# Patient Record
Sex: Female | Born: 1955
Health system: Southern US, Community
[De-identification: ages and names within clinical notes are randomized; demographics above are authoritative.]

## PROBLEM LIST (undated history)

## (undated) DIAGNOSIS — I10 Essential (primary) hypertension: Secondary | ICD-10-CM

## (undated) DIAGNOSIS — J45909 Unspecified asthma, uncomplicated: Secondary | ICD-10-CM

## (undated) DIAGNOSIS — F32A Depression, unspecified: Secondary | ICD-10-CM

## (undated) DIAGNOSIS — E785 Hyperlipidemia, unspecified: Secondary | ICD-10-CM

## (undated) DIAGNOSIS — R7303 Prediabetes: Secondary | ICD-10-CM

## (undated) DIAGNOSIS — K5909 Other constipation: Secondary | ICD-10-CM

## (undated) DIAGNOSIS — Z9289 Personal history of other medical treatment: Secondary | ICD-10-CM

## (undated) DIAGNOSIS — F329 Major depressive disorder, single episode, unspecified: Secondary | ICD-10-CM

## (undated) HISTORY — DX: Other constipation: K59.09

## (undated) HISTORY — DX: Hyperlipidemia, unspecified: E78.5

## (undated) HISTORY — PX: TONSILLECTOMY: SUR1361

## (undated) HISTORY — DX: Essential (primary) hypertension: I10

## (undated) HISTORY — PX: SHOULDER SURGERY: SHX246

## (undated) HISTORY — DX: Unspecified asthma, uncomplicated: J45.909

## (undated) HISTORY — DX: Personal history of other medical treatment: Z92.89

## (undated) HISTORY — PX: ABDOMINAL HYSTERECTOMY: SHX81

---

## 1999-12-13 ENCOUNTER — Other Ambulatory Visit: Admission: RE | Admit: 1999-12-13 | Discharge: 1999-12-13 | Payer: Self-pay | Admitting: Obstetrics and Gynecology

## 2001-03-29 ENCOUNTER — Other Ambulatory Visit: Admission: RE | Admit: 2001-03-29 | Discharge: 2001-03-29 | Payer: Self-pay | Admitting: Obstetrics and Gynecology

## 2002-05-06 ENCOUNTER — Other Ambulatory Visit: Admission: RE | Admit: 2002-05-06 | Discharge: 2002-05-06 | Payer: Self-pay | Admitting: Obstetrics and Gynecology

## 2003-05-12 ENCOUNTER — Other Ambulatory Visit: Admission: RE | Admit: 2003-05-12 | Discharge: 2003-05-12 | Payer: Self-pay | Admitting: Obstetrics and Gynecology

## 2003-09-08 ENCOUNTER — Inpatient Hospital Stay (HOSPITAL_COMMUNITY): Admission: RE | Admit: 2003-09-08 | Discharge: 2003-09-10 | Payer: Self-pay | Admitting: Obstetrics and Gynecology

## 2003-09-08 ENCOUNTER — Encounter (INDEPENDENT_AMBULATORY_CARE_PROVIDER_SITE_OTHER): Payer: Self-pay | Admitting: Specialist

## 2004-09-09 ENCOUNTER — Other Ambulatory Visit: Admission: RE | Admit: 2004-09-09 | Discharge: 2004-09-09 | Payer: Self-pay | Admitting: Obstetrics and Gynecology

## 2004-09-23 ENCOUNTER — Ambulatory Visit: Payer: Self-pay | Admitting: Internal Medicine

## 2004-10-03 ENCOUNTER — Ambulatory Visit: Payer: Self-pay | Admitting: Internal Medicine

## 2004-10-03 ENCOUNTER — Ambulatory Visit: Payer: Self-pay | Admitting: Pulmonary Disease

## 2005-06-01 ENCOUNTER — Ambulatory Visit: Payer: Self-pay | Admitting: Internal Medicine

## 2005-09-11 ENCOUNTER — Other Ambulatory Visit: Admission: RE | Admit: 2005-09-11 | Discharge: 2005-09-11 | Payer: Self-pay | Admitting: Obstetrics and Gynecology

## 2006-12-12 ENCOUNTER — Ambulatory Visit: Payer: Self-pay | Admitting: Cardiology

## 2006-12-12 LAB — CONVERTED CEMR LAB
ALT: 39 units/L (ref 0–40)
Bilirubin, Direct: 0.1 mg/dL (ref 0.0–0.3)
Cholesterol: 246 mg/dL (ref 0–200)
Total CHOL/HDL Ratio: 3.1
Total Protein: 7.3 g/dL (ref 6.0–8.3)
Triglycerides: 150 mg/dL — ABNORMAL HIGH (ref 0–149)

## 2007-03-21 ENCOUNTER — Ambulatory Visit: Payer: Self-pay | Admitting: Internal Medicine

## 2008-02-20 ENCOUNTER — Ambulatory Visit: Payer: Self-pay | Admitting: Internal Medicine

## 2008-03-04 ENCOUNTER — Ambulatory Visit: Payer: Self-pay | Admitting: Internal Medicine

## 2008-03-04 ENCOUNTER — Encounter: Payer: Self-pay | Admitting: Pulmonary Disease

## 2008-03-04 LAB — CONVERTED CEMR LAB
AST: 26 units/L (ref 0–37)
Vit D, 1,25-Dihydroxy: 52 (ref 30–89)

## 2008-03-09 ENCOUNTER — Ambulatory Visit: Payer: Self-pay | Admitting: Internal Medicine

## 2008-10-21 DIAGNOSIS — Z9089 Acquired absence of other organs: Secondary | ICD-10-CM | POA: Insufficient documentation

## 2008-10-21 DIAGNOSIS — F3289 Other specified depressive episodes: Secondary | ICD-10-CM | POA: Insufficient documentation

## 2008-10-21 DIAGNOSIS — F329 Major depressive disorder, single episode, unspecified: Secondary | ICD-10-CM

## 2008-10-21 DIAGNOSIS — E785 Hyperlipidemia, unspecified: Secondary | ICD-10-CM | POA: Insufficient documentation

## 2008-10-21 DIAGNOSIS — Z87898 Personal history of other specified conditions: Secondary | ICD-10-CM | POA: Insufficient documentation

## 2009-03-24 ENCOUNTER — Encounter: Admission: RE | Admit: 2009-03-24 | Discharge: 2009-03-24 | Payer: Self-pay | Admitting: Obstetrics and Gynecology

## 2009-03-29 ENCOUNTER — Ambulatory Visit: Payer: Self-pay | Admitting: Internal Medicine

## 2009-04-13 ENCOUNTER — Telehealth: Payer: Self-pay | Admitting: Internal Medicine

## 2009-04-29 ENCOUNTER — Ambulatory Visit: Payer: Self-pay | Admitting: Internal Medicine

## 2009-05-10 ENCOUNTER — Telehealth: Payer: Self-pay | Admitting: Internal Medicine

## 2009-07-14 ENCOUNTER — Telehealth: Payer: Self-pay | Admitting: Internal Medicine

## 2009-07-16 ENCOUNTER — Encounter: Payer: Self-pay | Admitting: Internal Medicine

## 2010-01-21 ENCOUNTER — Encounter (INDEPENDENT_AMBULATORY_CARE_PROVIDER_SITE_OTHER): Payer: Self-pay | Admitting: *Deleted

## 2010-03-15 ENCOUNTER — Ambulatory Visit: Payer: Self-pay | Admitting: Internal Medicine

## 2010-04-18 ENCOUNTER — Ambulatory Visit: Payer: Self-pay | Admitting: Internal Medicine

## 2010-04-18 DIAGNOSIS — G4731 Primary central sleep apnea: Secondary | ICD-10-CM | POA: Insufficient documentation

## 2010-05-04 ENCOUNTER — Encounter: Payer: Self-pay | Admitting: Internal Medicine

## 2010-05-04 ENCOUNTER — Telehealth (INDEPENDENT_AMBULATORY_CARE_PROVIDER_SITE_OTHER): Payer: Self-pay | Admitting: *Deleted

## 2010-05-16 ENCOUNTER — Telehealth: Payer: Self-pay | Admitting: Internal Medicine

## 2010-06-27 ENCOUNTER — Ambulatory Visit: Payer: Self-pay | Admitting: Internal Medicine

## 2010-07-13 ENCOUNTER — Encounter: Payer: Self-pay | Admitting: Internal Medicine

## 2010-07-13 LAB — CONVERTED CEMR LAB
Direct LDL: 113.9 mg/dL
HDL: 93.3 mg/dL (ref >39.00)
VLDL: 21.2 mg/dL (ref 0.0–40.0)

## 2010-08-29 ENCOUNTER — Encounter: Payer: Self-pay | Admitting: Obstetrics and Gynecology

## 2010-09-04 LAB — CONVERTED CEMR LAB: AST: 36 units/L (ref 0–37)

## 2010-09-06 NOTE — Progress Notes (Signed)
   Walk in Patient Form Recieved " Pt dropped off papers from [employer]" gave to Message Nurse [facility]  May 04, 2010 1:06 PM

## 2010-09-06 NOTE — Progress Notes (Signed)
Summary: calling for test results   Phone Note Call from Patient   Caller: Patient [phone] Reason for Call: Talk to Nurse, Lab or Test Results Summary of Call: pt calling for test results Initial call taken by: Glynda Jaeger,  May 16, 2010 11:47 AM  Follow-up for Phone Call        Called patient and [facility] that I have not received results of o2 overnight sats from Dr.Ross but I will follow up on this and call her back.  Layne Benton, RN, BSN  May 16, 2010 5:27 PM   Additional Follow-up for Phone Call Additional follow up Details #1::        Patient oxygen does go down below 90% about 2.5% of time sleeping.  Probably not significant sleep apnea given so little desat  Pateint did not want CPAP anyway. Additional Follow-up by: Sherrill Raring, MD, [facility],  May 18, 2010 11:23 AM     Appended Document: calling for test results Patient aware of above.

## 2010-09-06 NOTE — Assessment & Plan Note (Signed)
Summary: yearly/sl/appt time 4:15/lg  Medications Added FISH OIL 1000 MG CAPS (OMEGA-3 FATTY ACIDS) take one daily CRESTOR 20 MG TABS (ROSUVASTATIN CALCIUM) 1 tab every day        Visit Type:  Follow-up Primary Provider:  [facility]  CC:  no complaints.  History of Present Illness: patient is a 55 year old with a history of dyslipidemia.  I last saw her earlier this year.  she has been switched to crestor 5.  She is doing OK on this.  ON simvistatin she had some numbness in hands when using them.  This has improved.  No achieness elsewhere. Breathing is OK.  She is fatigued alot, falls asleep if not doing anything.   Husband says she snores but does not have sleep apnea. No chest pains.  Water aerobics 3x per wk.  Current Medications (verified): 1)  Advair Diskus 250-50 Mcg/dose Aepb (Fluticasone-Salmeterol) .... As Needed 2)  Ventolin Hfa 108 (90 Base) Mcg/act Aers (Albuterol Sulfate) .... As Needed 3)  Cymbalta 30 Mg Cpep (Duloxetine Hcl) .Marland Kitchen.. 1 Cap Once Daily .Huel Cote Unsure of Dose 4)  Multivitamins   Tabs (Multiple Vitamin) .Marland Kitchen.. 1 Tab Once Daily 5)  Crestor 5 Mg Tabs (Rosuvastatin Calcium) .Marland Kitchen.. 1 Tablet Every Day At Bedtime- As Directed 6)  Fish Oil 1000 Mg Caps (Omega-3 Fatty Acids) .... Take One Daily  Allergies: 1)  ! Tetracycline  Past History:  Past medical, surgical, family and social histories (including risk factors) reviewed, and no changes noted (except as noted below).  Past Medical History: Reviewed history from 10/21/2008 and no changes required. DEPRESSION (ICD-311) RHINITIS, HX OF (ICD-V13.8) ASTHMA, HX OF (ICD-493.90) DYSLIPIDEMIA (ICD-272.4)    Past Surgical History: Reviewed history from 10/21/2008 and no changes required. TONSILLECTOMY, HX OF (ICD-V45.79) HYSTERECTOMY (ICD-V88.01)  Family History: Reviewed history and no changes required.  Social History: Reviewed history from 10/21/2008 and no changes required. Full  Time Married  No tobacco  Vital Signs:  Patient profile:   55 year old female Height:      61.5 inches Weight:      160 pounds BMI:     29.85 Pulse rate:   104 / minute Pulse rhythm:   regular BP sitting:   118 / 86  (right arm)  Vitals Entered By: Jacquelin Hawking, CMA (April 18, 2010 4:14 PM)  Physical Exam  Additional Exam:  Patient is in NAD HEENT:  Normocephalic, atraumatic. EOMI, PERRLA.  Neck: JVP is normal. No thyromegaly. No bruits.  Lungs: clear to auscultation. No rales no wheezes.  Heart: Regular rate and rhythm. Normal S1, S2. No S3.   No significant murmurs. PMI not displaced.  Abdomen:  Supple, nontender. Normal bowel sounds. No masses. No hepatomegaly.  Extremities:   Good distal pulses throughout. No lower extremity edema.  Musculoskeletal :moving all extremities.  Neuro:   alert and oriented x3.    EKG  Procedure date:  04/18/2010  Findings:      NSR>  93 bpm.  Impression & Recommendations:  Problem # 1:  DYSLIPIDEMIA (ICD-272.4) LDL and LDL particle number are not optimal.  I would recommend increasing Crestor to 10  (cut 20 in 1/2). F/U lipid and AST in 8 to 10 wks.  I am not convinced hand symptoms are due to statin.  FOllow.  Work on diet, wt loss.  Problem # 2:  CENTRAL SLEEP APNEA CONDS CLASSIFIED ELSEWHERE (ICD-327.27) I am not sure f the patient has apnea.  She says she would  not be able to toleated CPAP if she did because she sleeps on her stomach and side.  Will set up for O2 sat at night.  If desats, sleep study.  Other Orders: EKG w/ Interpretation (93000) Misc. Referral (Misc. Ref)  Patient Instructions: 1)  Your physician recommends that you return for a FASTING lipid profile: LIPOMED and AST in 10 weeks 272.0 2)  Your physician wants you to follow-up in: 12 months with Dr.Alonso Gapinski  You will receive a reminder letter in the mail two months in advance. If you don't receive a letter, please call our office to schedule the follow-up  appointment. Prescriptions: CRESTOR 20 MG TABS (ROSUVASTATIN CALCIUM) 1 tab every day  #30 x 6   Entered by:   Layne Benton, RN, BSN   Authorized by:   Sherrill Raring, MD, [facility]   Signed by:   Layne Benton, RN, BSN on 04/18/2010   Method used:   Electronically to        [facility] (retail)       [address]       [place], [place]  [address]       Ph: [phone] or [phone]       Fax: [phone]   RxID:   [id number]

## 2010-09-06 NOTE — Letter (Signed)
Summary: Appointment - Reminder 2  [facility], Main Office  [address]   [place], [place] [address]   Phone: [phone]  Fax: [phone]     January 21, 2010 MRN: [id number]   [name] [address] [place], [place]  [address]   Dear Ms. Abood,  Our records indicate that it is time to schedule a follow-up appointment with Dr. Tenny Craw in August. It is very important that we reach you to schedule this appointment. We look forward to participating in your health care needs. Please contact us at the number listed above at your earliest convenience to schedule your appointment.  If you are unable to make an appointment at this time, give [place] a call so we can update our records.     Sincerely,   [facility] Scheduling Team

## 2010-09-06 NOTE — Letter (Signed)
Summary: [place] - Walk-In Pt Form  [place] - Walk-In Pt Form   Imported By: Marylou Mccoy 07/05/2010 13:26:17  _____________________________________________________________________  External Attachment:    Type:   Image     Comment:   External Document

## 2010-09-06 NOTE — Letter (Signed)
Summary: [name]   Imported By: Marylou Mccoy 05/20/2010 11:35:16  _____________________________________________________________________  External Attachment:    Type:   Image     Comment:   External Document

## 2010-12-20 NOTE — Assessment & Plan Note (Signed)
[facility]                            CARDIOLOGY OFFICE NOTE   NAME:[name], [name]                      MRN:          [id number]  DATE:02/20/2008                            DOB:          06-17-1956    IDENTIFICATION:  [name] is a 55 year old woman.  I last saw her in  clinic back in May 2008.  She has a history of dyslipidemia, asthma.  Question sleep apnea.   Since seen, she has done okay.  She states she is sleeping better.  She  denies significant shortness of breath.  She is trying to exercise more  and trying to work on cutting her weight down.   CURRENT MEDICINES:  1. Advair 250/50.  2. Cymbalta.  3. Simvastatin 10.   PHYSICAL EXAMINATION:  GENERAL:  The patient is in no distress.  VITAL SIGNS:  Blood pressure is 123/86, pulse 91 and regular, and weight  is 162.  LUNGS:  Clear.  Moving air well.  CARDIAC:  Regular rate and rhythm.  S1 and S2.  No murmurs.  ABDOMEN:  Benign.  EXTREMITIES:  No edema.   A 12-lead EKG, normal sinus rhythm, 86 beats per minute.   IMPRESSION:  1. Dyslipidemia.  Need to get fasting lipids.  I will be in touch with      her when I have seen the test results.  2. History of asthma, doing well clinically.  3. History of question sleep apnea.  The patient declined sleep study      in the past.  4. Obesity.  Again, counseled on exercise, diet, and trying to get her      weight down, indeed her dyslipidemia may improve significantly.   Tentatively, I plan to be in touch with the patient regarding the test  results and otherwise set followup for 1 year.     Pricilla Riffle, MD, [facility]  Electronically Signed    PVR/MedQ  DD: 02/20/2008  DT: 02/21/2008  Job #: [id number]   cc:   [name], M.D.

## 2010-12-23 NOTE — Op Note (Signed)
NAME:  [name], [name]                         ACCOUNT NO.:  [id number]   MEDICAL RECORD NO.:  [id number]                   PATIENT TYPE:  INP   LOCATION:  9316                                 [facility]ILITY:  [facility]   PHYSICIAN:  Duke Salvia. Marcelle Overlie, M.D.            DATE OF BIRTH:  [birth year]   DATE OF PROCEDURE:  09/08/2003  DATE OF DISCHARGE:                                 OPERATIVE REPORT   PREOPERATIVE DIAGNOSIS:  Symptomatic leiomyoma.   POSTOPERATIVE DIAGNOSIS:  Symptomatic leiomyoma.   PROCEDURES:  Diagnostic laparoscopy followed by open supracervical  hysterectomy.   SURGEON:  Duke Salvia. Marcelle Overlie, M.D.   ASSISTANT:  Guy Sandifer. Henderson Cloud, M.D.   ANESTHESIA:  General endotracheal.   COMPLICATIONS:  None.   DRAINS:  Foley catheter.   ESTIMATED BLOOD LOSS:  100.   SPECIMENS REMOVED:  Fundus of the uterus.   PROCEDURE AND FINDINGS:  The patient went to the operating room.  After an  adequate level of general endotracheal anesthesia was obtained with the  patient's legs in stirrups, the abdomen, perineum, and vagina were prepped  and draped with Betadine and the bladder was drained with a Foley catheter,  EUA carried out.  The uterus was 12 weeks' size with a fibroid on the right  lateral wall as noted previously.  A sound was positioned in the uterus and  held in place with a single-tooth tenaculum taped to the wound.  Attention  directed to the abdomen. A 2 cm subumbilical incision was made after  infiltrating with 0.5% Marcaine plain.  The Veress needle was introduced  without difficulty.  Its intra-abdominal position was verified by pressure  and water testing.  After a 2.5 L pneumoperitoneum was then created,  laparoscopic trocar and sleeve were then introduced without difficulty.  There was no evidence of any bleeding or trauma.  The patient was then  placed in Trendelenburg.  Initial inspection revealed the cul-de-sac was  free and clear.  The uterus was enlarged with a 7-8  cm fibroid on the right  lateral wall out to the sidewall.  I could get good visualization of the  left side of the uterus but, unfortunately, I did not think laparoscopically  I could get good access to the uterine vasculature pedicle on that side.  A  decision made to proceed with an open procedure.  A Pfannenstiel incision  was made, carried down to the fascia, which was incised and extended  transversely.  The rectus muscle was divided in the midline, the peritoneum  __________ without incident, and extended in a vertical manner.  The  O'Connor-O'Sullivan retractor was positioned, and the bowel was packed  superiorly out of the field.  A double-toothed tenaculum was used to grab  the fundus of the uterus with the fibroid.  Starting on the left, the  Harmonic scalpel handpiece was used to coagulate and cut the utero-ovarian  pedicle down to including the  left round ligament.  The same was repeated on  the opposite side, conserving both normal-appearing ovaries.  Prior to this  the upper abdomen had been inspected and was normal.  After this was  completed, the anterior peritoneum was dissected with the LCS/Harmonic  scalpel.  The bladder was advanced superiorly with minimal blunt dissection.  The Harmonic scalpel was then used to coagulate and cut the ascending  uterine artery on each side.  The LCS was then also used to dissect free the  fundus of the uterus.  Low-power coagulation was used to coagulate the  endocervical canal.  This was irrigated and noted to be completely  hemostatic.  Interceed was placed across the stump.  All other pedicles had  been previously inspected after irrigation and aspiration and were  hemostatic.  Prior to closure, sponge, needle, and instrument counts  reported as correct x2.  The peritoneum closed with a running 2-0 Dexon  suture.  The fascia closed from laterally to midline on either side with a 0  Dexon running suture.  Prior to doing this a  subfascial local anesthetic  catheter was positioned and brought out through a separate stab wound  superiorly.  A second catheter was placed in the subcu space and the skin  closed with staples and Steri-Strips.  The pain pump was primed with 1%  Xylocaine and hooked to the reservoir of 0.5% Marcaine plain.  She tolerated  this well and went to the recovery room in good condition.                                               Richard M. Marcelle Overlie, M.D.    RMH/MEDQ  D:  09/08/2003  T:  09/08/2003  Job:  [id number]

## 2010-12-23 NOTE — H&P (Signed)
NAME:  [name], [name]                         ACCOUNT NO.:  [id number]   MEDICAL RECORD NO.:  [id number]                   PATIENT TYPE:  INP   LOCATION:  NA                                   [facility]   PHYSICIAN:  Duke Salvia. Marcelle Overlie, M.D.            DATE OF BIRTH:  1955-10-18   DATE OF ADMISSION:  DATE OF DISCHARGE:                                HISTORY & PHYSICAL   DATE OF SCHEDULED SURGERY:  September 08, 2003   CHIEF COMPLAINT:  Symptomatic leiomyoma.   HISTORY OF PRESENT ILLNESS:  A 55 year old G0 P0 who has been on OCPs for  contraception.  She complained of increased pelvic pressure, especially on  the right, in September 2003.  An ultrasound was done at that time that  showed right lateral fibroid 5.4 cm.  Since that time her symptoms have  worsened considerably.  Follow-up ultrasound was done June 12, 2003 that  showed the fibroid had increased to 7 x 6 x 6.7 cm with no other adnexal  abnormalities noted.  She presents at this time for laparoscopic  supracervical hysterectomy.  This procedure including risks of bleeding,  infection, transfusion, adjacent organ injury reviewed with her.  She  understands that if access to the area of the uterine arteries is not  technically feasible with the laparoscopic approach she may require a  laparotomy.  In that circumstance she prefers to proceed with supracervical  hysterectomy with conservation of her ovaries if they appear normal.  Other  risks relative to wound infection, transfusion, phlebitis, and her expected  recovery time all reviewed with her which she understands and accepts.   PAST MEDICAL HISTORY:  1. Allergies:  DOXYCYCLINE.  2. Prior surgery:  Tonsillectomy.  3. Medications:  Cymbalta.   OBSTETRICAL HISTORY:  She has never been pregnant.   REVIEW OF SYSTEMS:  Significant for history of asthma.  She is seeing the  doctors at [facility].  She is currently on a Ventolin inhaler  and Advair  daily along with her OCPs.   PHYSICAL EXAMINATION:  VITAL SIGNS:  Temperature 98.2, blood pressure  120/68.  HEENT:  Unremarkable.  NECK:  Supple without masses.  LUNGS:  Clear.  CARDIOVASCULAR:  Regular rate and rhythm without murmurs, rubs, or gallops  noted.  BREASTS:  Without masses.  ABDOMEN:  Soft, flat, and nontender.  PELVIC:  Normal external genitalia, vagina and cervix were clear.  Uterus 12-  week size.  Adnexa unremarkable.  EXTREMITIES AND NEUROLOGIC:  Unremarkable.   IMPRESSION:  Symptomatic leiomyoma.   PLAN:  LSH, possible laparotomy with supracervical hysterectomy as noted  above.  Procedure and risks reviewed as above also.  Richard M. Marcelle Overlie, M.D.    RMH/MEDQ  D:  09/03/2003  T:  09/03/2003  Job:  [id number]

## 2010-12-23 NOTE — Assessment & Plan Note (Signed)
[facility]                            CARDIOLOGY OFFICE NOTE   NAME:[name], [name]                      MRN:          [id number]  DATE:12/12/2006                            DOB:          1955/10/05    SUBJECTIVE:  This is a 55 year old married white female patient of Dr.  Pricilla Riffle', who is followed for hyperlipidemia.  The patient has  been on Vytorin and has had no difficulties with this.  She is due for  medication refills and is here today for that.  She does question the  benefit of Vytorin, after the recent studies . She asks if there is  another drug that she can take.  She has no history of hypertension or  diabetes and no family history of coronary artery disease, and she is a  nonsmoker.  She is overweight but does exercise doing water aerobics  three days a week, as well as Yoga when she can.   Her main complaint is that she becomes very sleepy at work.  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  She has increased  her caffeine to four diet Coke a day, as well as a large iced coffee in  the morning.  She denies any chest pain, dizziness or pre-syncope.  There has been a question of sleep apnea in the past, and she has seen  Dr. Barbaraann Share but did never proceed with the sleep study.  She was  hoping to lose weight, but that has not happened.   CURRENT MEDICATIONS:  1. Advair 250/50 mg.  2. Cymbalta.  3. Vytorin 10/10 mg, one p.o. q.h.s.   PHYSICAL EXAMINATION:  GENERAL:  This is a pleasant 55 year old white  female, in no acute distress.  VITAL SIGNS:  Blood pressure 108/80, pulse 92, weight 166 pounds.  NECK:  Without jugular venous distention, HSR, bruit or thyroid  enlargement.  LUNGS:  Clear anterior, posterior and lateral.  HEART:  Regular rate and rhythm at 90 beats per minute.  Normal S1 and  S2.  No murmur, rub, bruit, thrill  or heave noted.  ABDOMEN:  Soft, without organomegaly, masses, lesions or abnormal  tenderness.  EXTREMITIES:  Without clubbing, cyanosis or edema.  She has good distal  pulses.   IMPRESSION:  1. Dyslipidemia.  2. History of asthma.  3. Daytime sleepiness, has been suspected secondary to obstructive      sleep apnea syndrome, but never had a sleep study.  4. Obesity.  5. Status post hysterectomy.  6. Status post tonsillectomy.   PLAN:  We will check a fasting lipid panel today.  I have given her a  prescription for simvastatin 10 mg instead of  the Vytorin, and we will  change this if needed, based on her lipid panel.  I have asked her to  discuss with Dr. Teena Irani. Arlyce Dice a possible sleep study, which can be  arranged with Dr. Shelle Iron if she wants to proceed.  I have asked her to  decrease her caffeine intake and increase her exercise to lose weight.   She can see Dr. Dietrich Pates back in followup in one year, if her lipid  panel is stable.      Jacolyn Reedy, PA-C  Electronically Signed      Cecil Cranker, MD, [facility]  Electronically Signed   ML/MedQ  DD: 12/12/2006  DT: 12/12/2006  Job #: [id number]   cc:   Teena Irani. Arlyce Dice, M.D.

## 2010-12-23 NOTE — Discharge Summary (Signed)
NAME:  [name], [name]                         ACCOUNT NO.:  [id number]   MEDICAL RECORD NO.:  [id number]                   PATIENT TYPE:  INP   LOCATION:  9316                                 [facility]   PHYSICIAN:  Duke Salvia. Marcelle Overlie, M.D.            DATE OF BIRTH:  1955/11/12   DATE OF ADMISSION:  09/08/2003  DATE OF DISCHARGE:                                 DISCHARGE SUMMARY   DISCHARGE DIAGNOSES:  1. Symptomatic leiomyoma.  2. Diagnostic laparoscopy followed by laparotomy with supracervical     hysterectomy this admission.   SUMMARY OF THE HISTORY AND PHYSICAL EXAMINATION:  Please see admission H&P  for details.  Briefly, a 55 year old G0 P0 with a symptomatic fibroid  presents for supracervical hysterectomy.   HOSPITAL COURSE:  On September 08, 2003 under general anesthesia the patient  underwent diagnostic laparoscopy initially with the intent to evaluate for  possible LSH.  Due to the size of the fibroid decision made to proceed with  an open case.  A supracervical hysterectomy was performed with an EBL of 100  mL.  The afternoon of surgery her hemoglobin was stable; from a preoperative  of 14.4 was 12.8.  Postoperative day #1 she was afebrile, the catheter was  removed, she was allowed to ambulate.  She did have Accufusor local  anesthetic pump for pain management.  Postoperative day #1 hemoglobin was  12.3, WBC 9300.  On postoperative day #2 she was tolerating a regular diet,  ambulating without difficulty, was ready for discharge at that point.   OTHER LABORATORY DATA:  CBC on admission:  WBC 7.7, hemoglobin 14.4.  UA was  negative except for small ketones.  Postoperative hemoglobin on February 2  was 12.3 with a WBC of 9300.   DISPOSITION:  The patient was discharged on Tylox p.r.n. pain.  Will return  to the office in 3 days to have the clips removed.  Advised to report any  incisional redness or drainage, increased pain or bleeding, or fever over  101.  She was  given specific instructions regarding diet, sex, exercise.   CONDITION:  Good.   ACTIVITY:  Gradually increase.                                               Richard M. Marcelle Overlie, M.D.    RMH/MEDQ  D:  09/10/2003  T:  09/10/2003  Job:  [id number]

## 2010-12-23 NOTE — Assessment & Plan Note (Signed)
[facility]                            CARDIOLOGY OFFICE NOTE   NAME:[name], [name]                      MRN:          [id number]  DATE:12/12/2006                            DOB:          02-16-1956    Please send a copy of office note on this patient (#[id number]), to Dr.  Teena Irani. Arlyce Dice.      Jacolyn Reedy, PA-C       Cecil Cranker, MD, [facility]    ML/MedQ  DD: 12/12/2006  DT: 12/12/2006  Job #: [id number]

## 2011-02-09 ENCOUNTER — Other Ambulatory Visit: Payer: Self-pay

## 2011-02-10 ENCOUNTER — Telehealth: Payer: Self-pay | Admitting: Internal Medicine

## 2011-02-10 DIAGNOSIS — E7800 Pure hypercholesterolemia, unspecified: Secondary | ICD-10-CM

## 2011-02-10 NOTE — Telephone Encounter (Signed)
Pt need to be set up for lab work prior to appt.

## 2011-02-10 NOTE — Telephone Encounter (Signed)
Patient has an appointment with Dr. Tenny Craw on 04/20/11 . She would like to have labs done prior O/V. An appointment was made for Lipid panel,AST lab work for the week of August 9 th 2012 at [place] lab. Patient aware.

## 2011-03-16 ENCOUNTER — Other Ambulatory Visit (INDEPENDENT_AMBULATORY_CARE_PROVIDER_SITE_OTHER): Payer: Self-pay

## 2011-03-16 ENCOUNTER — Other Ambulatory Visit: Payer: Self-pay | Admitting: Internal Medicine

## 2011-03-16 DIAGNOSIS — E7800 Pure hypercholesterolemia, unspecified: Secondary | ICD-10-CM

## 2011-03-16 LAB — AST: AST: 25 U/L (ref 0–37)

## 2011-03-16 LAB — LDL CHOLESTEROL, DIRECT: Direct LDL: 111.4 mg/dL

## 2011-03-16 LAB — LIPID PANEL: VLDL: 27.8 mg/dL (ref 0.0–40.0)

## 2011-03-24 ENCOUNTER — Telehealth: Payer: Self-pay | Admitting: Internal Medicine

## 2011-03-24 NOTE — Telephone Encounter (Signed)
Returning call back to nurse Annice Pih.

## 2011-03-24 NOTE — Telephone Encounter (Signed)
Called patient with lab results.  

## 2011-04-19 ENCOUNTER — Encounter: Payer: Self-pay | Admitting: Internal Medicine

## 2011-04-20 ENCOUNTER — Encounter: Payer: Self-pay | Admitting: Internal Medicine

## 2011-04-20 ENCOUNTER — Ambulatory Visit (INDEPENDENT_AMBULATORY_CARE_PROVIDER_SITE_OTHER): Payer: Self-pay | Admitting: Internal Medicine

## 2011-04-20 VITALS — BP 130/76 | HR 88 | Ht 61.5 in | Wt 157.0 lb

## 2011-04-20 DIAGNOSIS — E785 Hyperlipidemia, unspecified: Secondary | ICD-10-CM

## 2011-04-20 NOTE — Assessment & Plan Note (Signed)
Patient claims she is feeling better off of crestor.  Wants to try supplements.  Will recheck lipids in spring.  She will call to set up Encouraged her to increase her activity, decrease wt.

## 2011-04-20 NOTE — Progress Notes (Signed)
HPI Patient is a 55 year old with a hisotry of dyslipidemia.   I saw her in clinic 1 year ago. Felt foggy on Crestor  Stopped Crestor.  Now taking vit supplement Dottera.  Feeling a lot better. Weight better.  Staying active.  Allergies  Allergen Reactions  . Tetracycline     Current Outpatient Prescriptions  Medication Sig Dispense Refill  . albuterol (VENTOLIN HFA) 108 (90 BASE) MCG/ACT inhaler Inhale 2 puffs into the lungs every 6 (six) hours as needed.        . CYMBALTA 60 MG capsule Take 1 tablet by mouth Daily.      . Fluticasone-Salmeterol (ADVAIR) 100-50 MCG/DOSE AEPB Inhale 1 puff into the lungs as needed.          Past Medical History  Diagnosis Date  . Hypertension   . Coronary artery disease   . Morbid obesity     No past surgical history on file.  No family history on file.  History   Social History  . Marital Status: Married    Spouse Name: [name]    Number of Children: [name]  . Years of Education: [name]   Occupational History  . Not on file.   Social History Main Topics  . Smoking status: Never Smoker   . Smokeless tobacco: Not on file  . Alcohol Use: Not on file  . Drug Use: Not on file  . Sexually Active: Not on file   Other Topics Concern  . Not on file   Social History Narrative  . No narrative on file    Review of Systems:  All systems reviewed.  They are negative to the above problem except as previously stated.  Vital Signs: BP 130/76  Pulse 88  Ht 5' 1.5" (1.562 m)  Wt 157 lb (71.215 kg)  BMI 29.18 kg/m2  Physical Exam  Patient is in NAD  HEENT:  Normocephalic, atraumatic. EOMI, PERRLA.  Neck: JVP is normal. No thyromegaly. No bruits.  Lungs: clear to auscultation. No rales no wheezes.  Heart: Regular rate and rhythm. Normal S1, S2. No S3.   No significant murmurs. PMI not displaced.  Abdomen:  Supple, nontender. Normal bowel sounds. No masses. No hepatomegaly.  Extremities:   Good distal pulses throughout. No lower extremity  edema.  Musculoskeletal :moving all extremities.  Neuro:   alert and oriented x3.  CN II-XII grossly intact.   Assessment and Plan:

## 2011-07-13 ENCOUNTER — Encounter: Payer: Self-pay | Admitting: Gastroenterology

## 2011-08-10 ENCOUNTER — Ambulatory Visit: Payer: BC Managed Care – PPO

## 2011-08-10 DIAGNOSIS — E7800 Pure hypercholesterolemia, unspecified: Secondary | ICD-10-CM

## 2011-08-10 LAB — LIPID PANEL
Cholesterol: 291 mg/dL — ABNORMAL HIGH (ref 0–200)
VLDL: 27 mg/dL (ref 0.0–40.0)

## 2011-08-14 ENCOUNTER — Other Ambulatory Visit: Payer: BC Managed Care – PPO | Admitting: Gastroenterology

## 2011-08-21 ENCOUNTER — Other Ambulatory Visit: Payer: Self-pay | Admitting: *Deleted

## 2011-08-21 ENCOUNTER — Telehealth: Payer: Self-pay | Admitting: *Deleted

## 2011-08-21 DIAGNOSIS — E7800 Pure hypercholesterolemia, unspecified: Secondary | ICD-10-CM

## 2011-08-21 MED ORDER — ATORVASTATIN CALCIUM 40 MG PO TABS: 40.0000 mg | ORAL_TABLET | Freq: Every day | ORAL | Status: DC

## 2011-08-21 NOTE — Telephone Encounter (Signed)
Called patient with event monitor results. Advised NSR with 2 brief episodes of SVT. Patient will continue taking Metoprolol per Dr.Ross. She will call us with any change of condition and follow up PRN. Will continue to see primary care doctor.

## 2011-10-10 ENCOUNTER — Other Ambulatory Visit: Payer: BC Managed Care – PPO

## 2011-12-28 ENCOUNTER — Other Ambulatory Visit: Payer: Self-pay | Admitting: Internal Medicine

## 2011-12-28 DIAGNOSIS — E7800 Pure hypercholesterolemia, unspecified: Secondary | ICD-10-CM

## 2011-12-28 NOTE — Telephone Encounter (Signed)
transferring  Refill medication to  [facility]. [phone] 30 month supply.

## 2011-12-29 MED ORDER — ATORVASTATIN CALCIUM 40 MG PO TABS: 40.0000 mg | ORAL_TABLET | Freq: Every day | ORAL | Status: DC

## 2011-12-29 NOTE — Telephone Encounter (Signed)
..   Requested Prescriptions   Pending Prescriptions Disp Refills  . atorvastatin (LIPITOR) 40 MG tablet 30 tablet 6    Sig: Take 1 tablet (40 mg total) by mouth daily.

## 2012-01-22 ENCOUNTER — Encounter (HOSPITAL_COMMUNITY): Payer: Self-pay

## 2012-01-22 ENCOUNTER — Emergency Department (HOSPITAL_COMMUNITY)
Admission: EM | Admit: 2012-01-22 | Discharge: 2012-01-22 | Disposition: A | Discharge status: Home / Self Care | Payer: 59 | Source: Home / Self Care | Attending: Family Medicine | Admitting: Family Medicine

## 2012-01-22 DIAGNOSIS — K5900 Constipation, unspecified: Secondary | ICD-10-CM

## 2012-01-22 DIAGNOSIS — K5641 Fecal impaction: Secondary | ICD-10-CM

## 2012-01-22 HISTORY — DX: Major depressive disorder, single episode, unspecified: F32.9

## 2012-01-22 HISTORY — DX: Depression, unspecified: F32.A

## 2012-01-22 MED ORDER — PEG 3350-KCL-NA BICARB-NACL 420 G PO SOLR: ORAL | Status: DC

## 2012-01-22 NOTE — ED Notes (Signed)
Pt c/o constipation.  Pt states LBM 2 days ago.  Pt states she attempted to extract stool, was able to remove a small amount but states it felt extremely hard.

## 2012-01-22 NOTE — Discharge Instructions (Signed)
Take the prescribed medication as instructed. Can also use an over-the-counter glycerin suppository prior start taking the GoLYTELY solution. For good lubrication and protection around the rectal area. Go to the emergency department if unable to have a bowel movement or worsening symptoms like abdominal pain nausea vomiting despite following treatment. For maintenance I recommend a balanced diet with daily vegetable and fiber intake and plenty of fluids especially during summer time to keep you hydrated. Follow handout instructions for supportive care. Can use over-the-counter MiraLax one cup full mix with 8 ounces of Gatorade daily as needed for constipation. I recommend that you see a gastroenterologist doctor if persistent or worsening symptoms as colonoscopy is recommended after 55 years of age and constipation can be a sign of problems with urine test and including tumors etc. Can call number above to followup with a gastroenterologist as needed.

## 2012-01-24 NOTE — ED Provider Notes (Signed)
History     CSN: [id number]  Arrival date & time 01/22/12  1741   First MD Initiated Contact with Patient 01/22/12 1821      Chief Complaint  Patient presents with  . Constipation    (Consider location/radiation/quality/duration/timing/severity/associated sxs/prior treatment) HPI Comments: 56 year old female with history of hypertension coronary artery disease among other co morbidities. Here complaining of difficult with defecation. Patient has a history of constipation with hard stools. Reports last bowel movement was 2 days ago very few stools and very hard. Has been straining today all day trying to defecate she feels stools are "stocked" in her rectal area. So nauseous and dizzy while straining earlier today. Denies currently nausea, abdominal pain, vomiting or diarrhea. No dysuria or hematuria. No headache. No fever or chills. Does not take regular stool softeners. Reports she exercise daily. Patient admits to forgetting to drink fluids during the day. She has had history constipation for a long time. No recent unintended weight loss. Has not had a colonoscopy.   Past Medical History  Diagnosis Date  . Hypertension   . Coronary artery disease   . Depression     Past Surgical History  Procedure Date  . Abdominal hysterectomy   . Tonsillectomy     No family history on file.  History  Substance Use Topics  . Smoking status: Former Games developer  . Smokeless tobacco: Not on file  . Alcohol Use: Yes    OB History    Grav Para Term Preterm Abortions TAB SAB Ect Mult Living                  Review of Systems  Constitutional: Negative for fever, chills, diaphoresis, activity change, appetite change, fatigue and unexpected weight change.  Gastrointestinal: Positive for constipation. Negative for nausea, vomiting, abdominal pain, diarrhea, blood in stool and anal bleeding.       Rectal pressure.  Genitourinary: Negative for pelvic pain.  Musculoskeletal: Negative for back  pain.  Skin: Negative for rash.  Neurological: Negative for dizziness and headaches.    Allergies  Tetracycline  Home Medications   Current Outpatient Rx  Name Route Sig Dispense Refill  . ALBUTEROL SULFATE HFA 108 (90 BASE) MCG/ACT IN AERS Inhalation Inhale 2 puffs into the lungs every 6 (six) hours as needed.      . ATORVASTATIN CALCIUM 40 MG PO TABS Oral Take 1 tablet (40 mg total) by mouth daily. 30 tablet 6  . CYMBALTA 60 MG PO CPEP Oral Take 1 tablet by mouth Daily.    Marland Kitchen FLUTICASONE-SALMETEROL 100-50 MCG/DOSE IN AEPB Inhalation Inhale 1 puff into the lungs as needed.      Marland Kitchen PEG 3350-KCL-NA BICARB-NACL 420 G PO SOLR  Drink 8 ounces (240 mL) every 10 minutes until clear or fluid per rectum. 4000 mL 0    BP 151/89  Pulse 96  Temp 98 F (36.7 C) (Oral)  Resp 20  SpO2 98%  Physical Exam  Nursing note and vitals reviewed. Constitutional: She is oriented to person, place, and time. She appears well-developed and well-nourished. No distress.  HENT:  Mouth/Throat: Oropharynx is clear and moist.  Cardiovascular: Normal rate, regular rhythm and normal heart sounds.   Pulmonary/Chest: Effort normal and breath sounds normal.  Abdominal: Soft. Bowel sounds are normal. She exhibits no distension and no mass. There is no tenderness. There is no rebound and no guarding.       Rectal exam: Few external hemorrhoids with no signs of inflammation. No rectal fissure.  Rectal sphincter felt with normal tone. Rectal ampula with hard compact stools close to rectal sphincter. Consistent with fecal impaction.    Neurological: She is alert and oriented to person, place, and time.  Skin: No rash noted.    ED Course  Fecal disimpaction Performed by: Sharin Grave Authorized by: Sharin Grave Risks and benefits: risks, benefits and alternatives were discussed Consent given by: patient Patient understanding: patient states understanding of the procedure being performed Patient consent:  the patient's understanding of the procedure matches consent given Patient tolerance: Patient tolerated the procedure well with no immediate complications. Comments: Copious amount of lubrication used to performed digital rectal exam and hard to stools manual disimpaction. Patient was able to evacuate hard stools followed by consistent amount of softer stools. Reports immediate relief after defecation.    (including critical care time)  Labs Reviewed - No data to display No results found.   1. Constipation       MDM  56 year old female with history of intermittent constipation. Here with rectal impaction. Was able to disimpact patient during rectal examination. Patient was able to have a bowel movement with evacuation of hard stool followed by soft stools and reported immediate relief of her symptoms prior to discharge. Prescribed GoLYTELY. Encourage hydration and other supportive care instructions discussed and provided in writing. Patient was asked to followup with her primary care provider to discuss screening colonoscopy.        Sharin Grave, MD 01/24/12 1044

## 2012-02-06 ENCOUNTER — Encounter (HOSPITAL_COMMUNITY): Payer: Self-pay | Admitting: Emergency Medicine

## 2012-02-06 ENCOUNTER — Emergency Department (HOSPITAL_COMMUNITY)
Admission: EM | Admit: 2012-02-06 | Discharge: 2012-02-06 | Disposition: A | Discharge status: Home / Self Care | Payer: 59 | Source: Home / Self Care | Attending: Family Medicine | Admitting: Family Medicine

## 2012-02-06 DIAGNOSIS — J020 Streptococcal pharyngitis: Secondary | ICD-10-CM

## 2012-02-06 MED ORDER — CEFDINIR 300 MG PO CAPS: 300.0000 mg | ORAL_CAPSULE | Freq: Two times a day (BID) | ORAL | Status: AC

## 2012-02-06 NOTE — ED Provider Notes (Signed)
History     CSN: [id number]  Arrival date & time 02/06/12  1616   First MD Initiated Contact with Patient 02/06/12 1618      No chief complaint on file.   (Consider location/radiation/quality/duration/timing/severity/associated sxs/prior treatment) Patient is a 56 y.o. female presenting with pharyngitis. The history is provided by the patient.  Sore Throat This is a new problem. The current episode started more than 2 days ago. The problem has been gradually worsening. The symptoms are aggravated by swallowing.    Past Medical History  Diagnosis Date  . Hypertension   . Coronary artery disease   . Depression     Past Surgical History  Procedure Date  . Abdominal hysterectomy   . Tonsillectomy     No family history on file.  History  Substance Use Topics  . Smoking status: Former Games developer  . Smokeless tobacco: Not on file  . Alcohol Use: Yes    OB History    Grav Para Term Preterm Abortions TAB SAB Ect Mult Living                  Review of Systems  Constitutional: Positive for fever and appetite change.  HENT: Positive for sore throat. Negative for congestion, rhinorrhea and postnasal drip.   Respiratory: Negative.   Cardiovascular: Negative.   Gastrointestinal: Negative.   Skin: Negative for rash.    Allergies  Tetracycline  Home Medications   Current Outpatient Rx  Name Route Sig Dispense Refill  . ALBUTEROL SULFATE HFA 108 (90 BASE) MCG/ACT IN AERS Inhalation Inhale 2 puffs into the lungs every 6 (six) hours as needed.      . ATORVASTATIN CALCIUM 40 MG PO TABS Oral Take 1 tablet (40 mg total) by mouth daily. 30 tablet 6  . CEFDINIR 300 MG PO CAPS Oral Take 1 capsule (300 mg total) by mouth 2 (two) times daily. 20 capsule 0  . CYMBALTA 60 MG PO CPEP Oral Take 1 tablet by mouth Daily.    Marland Kitchen FLUTICASONE-SALMETEROL 100-50 MCG/DOSE IN AEPB Inhalation Inhale 1 puff into the lungs as needed.      Marland Kitchen PEG 3350-KCL-NA BICARB-NACL 420 G PO SOLR  Drink 8 ounces  (240 mL) every 10 minutes until clear or fluid per rectum. 4000 mL 0    BP 142/93  Pulse 102  Temp 100.6 F (38.1 C) (Oral)  Resp 18  SpO2 99%  Physical Exam  Nursing note and vitals reviewed. Constitutional: She appears well-developed and well-nourished.  HENT:  Right Ear: External ear normal.  Left Ear: External ear normal.  Mouth/Throat: Uvula is midline. Posterior oropharyngeal erythema present. No oropharyngeal exudate or posterior oropharyngeal edema.  Eyes: Conjunctivae are normal. Pupils are equal, round, and reactive to light.  Neck: Normal range of motion. Neck supple.  Lymphadenopathy:    She has cervical adenopathy.    ED Course  Procedures (including critical care time)  Labs Reviewed - No data to display No results found.   1. Strep sore throat       MDM          Linna Hoff, MD 02/06/12 1728

## 2012-02-06 NOTE — ED Notes (Signed)
Sore throat, fever onset Saturday.  Patient has been at a convention and returned home.  Has been gargling and using ibuprofen.

## 2012-06-18 ENCOUNTER — Telehealth: Payer: Self-pay | Admitting: Internal Medicine

## 2012-06-18 ENCOUNTER — Other Ambulatory Visit: Payer: Self-pay | Admitting: Internal Medicine

## 2012-06-18 DIAGNOSIS — E7800 Pure hypercholesterolemia, unspecified: Secondary | ICD-10-CM

## 2012-06-18 MED ORDER — ATORVASTATIN CALCIUM 40 MG PO TABS: 40.0000 mg | ORAL_TABLET | Freq: Every day | ORAL | Status: DC

## 2012-06-18 NOTE — Telephone Encounter (Signed)
Pt needs refill on Lipitor and Pharm has been confirmed

## 2012-08-12 ENCOUNTER — Other Ambulatory Visit: Payer: 59

## 2012-08-23 ENCOUNTER — Ambulatory Visit: Payer: 59 | Admitting: Internal Medicine

## 2014-10-19 ENCOUNTER — Other Ambulatory Visit: Payer: Self-pay | Admitting: Obstetrics and Gynecology

## 2014-10-20 LAB — CYTOLOGY - PAP

## 2015-08-17 DIAGNOSIS — M65311 Trigger thumb, right thumb: Secondary | ICD-10-CM | POA: Diagnosis not present

## 2015-08-17 DIAGNOSIS — M79644 Pain in right finger(s): Secondary | ICD-10-CM | POA: Diagnosis not present

## 2015-08-17 DIAGNOSIS — M65331 Trigger finger, right middle finger: Secondary | ICD-10-CM | POA: Diagnosis not present

## 2015-08-17 MED FILL — DULoxetine HCL 60 MG CPEP: 60 | 90 days supply | Qty: 90 | Fill #0

## 2015-08-17 MED FILL — DICLOFENAC SOD EC 75 MG TAB: 75 | 30 days supply | Qty: 60 | Fill #0

## 2015-08-17 MED FILL — ATORVASTATIN 40 MG TABLET: 40 | 90 days supply | Qty: 90 | Fill #2

## 2015-08-23 DIAGNOSIS — H524 Presbyopia: Secondary | ICD-10-CM | POA: Diagnosis not present

## 2015-08-23 DIAGNOSIS — H5213 Myopia, bilateral: Secondary | ICD-10-CM | POA: Diagnosis not present

## 2015-08-23 DIAGNOSIS — H52223 Regular astigmatism, bilateral: Secondary | ICD-10-CM | POA: Diagnosis not present

## 2015-08-26 MED FILL — PENNSAID 2% PUMP: 2 | 30 days supply | Qty: 112 | Fill #0

## 2015-10-22 DIAGNOSIS — Z6831 Body mass index (BMI) 31.0-31.9, adult: Secondary | ICD-10-CM | POA: Diagnosis not present

## 2015-10-22 DIAGNOSIS — Z01419 Encounter for gynecological examination (general) (routine) without abnormal findings: Secondary | ICD-10-CM | POA: Diagnosis not present

## 2015-10-22 DIAGNOSIS — Z1231 Encounter for screening mammogram for malignant neoplasm of breast: Secondary | ICD-10-CM | POA: Diagnosis not present

## 2015-10-22 DIAGNOSIS — N760 Acute vaginitis: Secondary | ICD-10-CM | POA: Diagnosis not present

## 2015-10-22 MED FILL — metroNIDAZOLE 0.75 % GEL: 0.75 | 5 days supply | Qty: 70 | Fill #0

## 2015-10-22 MED FILL — ESTRACE 0.01% CREAM: 0.1 | 90 days supply | Qty: 43 | Fill #0

## 2015-10-22 MED FILL — FLUCONAZOLE 150 MG TABLET: 150 | 1 days supply | Qty: 1 | Fill #0

## 2015-11-08 MED FILL — DULoxetine HCL 60 MG CPEP: 60 | 90 days supply | Qty: 90 | Fill #0

## 2015-11-08 MED FILL — ATORVASTATIN 40 MG TABLET: 40 | 90 days supply | Qty: 90 | Fill #3

## 2015-11-17 MED FILL — DICLOFENAC SOD EC 75 MG TAB: 75 | 30 days supply | Qty: 60 | Fill #0

## 2015-11-23 DIAGNOSIS — J22 Unspecified acute lower respiratory infection: Secondary | ICD-10-CM | POA: Diagnosis not present

## 2015-11-23 MED FILL — HYDROCODONE-HOMATROPINE SYR: 5-1.5 | 6 days supply | Qty: 120 | Fill #0

## 2015-11-23 MED FILL — AZITHROMYCIN 250 MG TABLET: 250 | 5 days supply | Qty: 6 | Fill #0

## 2015-11-23 MED FILL — predniSONE 20 MG TABS: 20 | 9 days supply | Qty: 18 | Fill #0

## 2015-11-23 MED FILL — FLUTICASONE PROP 50 MCG SPR: 50 | 30 days supply | Qty: 16 | Fill #0

## 2015-12-21 DIAGNOSIS — L821 Other seborrheic keratosis: Secondary | ICD-10-CM | POA: Diagnosis not present

## 2015-12-21 DIAGNOSIS — D225 Melanocytic nevi of trunk: Secondary | ICD-10-CM | POA: Diagnosis not present

## 2015-12-21 DIAGNOSIS — L918 Other hypertrophic disorders of the skin: Secondary | ICD-10-CM | POA: Diagnosis not present

## 2015-12-21 DIAGNOSIS — K130 Diseases of lips: Secondary | ICD-10-CM | POA: Diagnosis not present

## 2015-12-21 DIAGNOSIS — L853 Xerosis cutis: Secondary | ICD-10-CM | POA: Diagnosis not present

## 2015-12-21 DIAGNOSIS — D224 Melanocytic nevi of scalp and neck: Secondary | ICD-10-CM | POA: Diagnosis not present

## 2015-12-21 DIAGNOSIS — L738 Other specified follicular disorders: Secondary | ICD-10-CM | POA: Diagnosis not present

## 2015-12-21 DIAGNOSIS — D2262 Melanocytic nevi of left upper limb, including shoulder: Secondary | ICD-10-CM | POA: Diagnosis not present

## 2016-02-02 MED FILL — AMOX-CLAV 875-125 MG TABLET: 875-125 | 10 days supply | Qty: 20 | Fill #0

## 2016-02-18 MED FILL — DULoxetine HCL 60 MG CPEP: 60 | 90 days supply | Qty: 90 | Fill #1

## 2016-02-18 MED FILL — ATORVASTATIN 40 MG TABLET: 40 | 30 days supply | Qty: 30 | Fill #0

## 2016-04-07 MED FILL — RESTASIS 0.05% EYE EMULSION: 0.05 | 90 days supply | Qty: 180 | Fill #0

## 2016-05-03 DIAGNOSIS — R079 Chest pain, unspecified: Secondary | ICD-10-CM | POA: Diagnosis not present

## 2016-05-03 DIAGNOSIS — R002 Palpitations: Secondary | ICD-10-CM | POA: Diagnosis not present

## 2016-05-03 DIAGNOSIS — R0602 Shortness of breath: Secondary | ICD-10-CM | POA: Diagnosis not present

## 2016-05-04 ENCOUNTER — Encounter: Payer: Self-pay | Admitting: *Deleted

## 2016-05-04 DIAGNOSIS — R0602 Shortness of breath: Secondary | ICD-10-CM | POA: Diagnosis not present

## 2016-05-04 DIAGNOSIS — R002 Palpitations: Secondary | ICD-10-CM | POA: Diagnosis not present

## 2016-05-09 ENCOUNTER — Ambulatory Visit (INDEPENDENT_AMBULATORY_CARE_PROVIDER_SITE_OTHER): Payer: 59 | Admitting: Physician Assistant

## 2016-05-09 ENCOUNTER — Encounter: Payer: Self-pay | Admitting: Physician Assistant

## 2016-05-09 ENCOUNTER — Telehealth: Payer: Self-pay | Admitting: *Deleted

## 2016-05-09 ENCOUNTER — Ambulatory Visit (INDEPENDENT_AMBULATORY_CARE_PROVIDER_SITE_OTHER)
Admission: RE | Admit: 2016-05-09 | Discharge: 2016-05-09 | Disposition: A | Discharge status: Home / Self Care | Payer: 59 | Source: Ambulatory Visit | Attending: Physician Assistant | Admitting: Physician Assistant

## 2016-05-09 VITALS — BP 126/64 | HR 88 | Ht 61.5 in | Wt 167.8 lb

## 2016-05-09 DIAGNOSIS — R7989 Other specified abnormal findings of blood chemistry: Secondary | ICD-10-CM

## 2016-05-09 DIAGNOSIS — R0602 Shortness of breath: Secondary | ICD-10-CM

## 2016-05-09 DIAGNOSIS — R002 Palpitations: Secondary | ICD-10-CM | POA: Diagnosis not present

## 2016-05-09 DIAGNOSIS — J4540 Moderate persistent asthma, uncomplicated: Secondary | ICD-10-CM

## 2016-05-09 DIAGNOSIS — E7800 Pure hypercholesterolemia, unspecified: Secondary | ICD-10-CM

## 2016-05-09 LAB — D-DIMER, QUANTITATIVE: D-Dimer, Quant: 0.64 ug/mL-FEU — ABNORMAL HIGH (ref 0.00–0.50)

## 2016-05-09 MED ORDER — IOPAMIDOL (ISOVUE-370) INJECTION 76%
80.0000 mL | Freq: Once | INTRAVENOUS | Status: AC | PRN
  Administered 2016-05-09: 80 mL via INTRAVENOUS

## 2016-05-09 NOTE — Telephone Encounter (Signed)
Pt has been notified of D dimer results are positive and that she will need to have a Chest CTA to r/o PE. Pt is agreeable to plan of care. I advised pt that the CT dept will call her to schedule.   I then s/w Stacy in our CT dept who has been advised per Richardson Dopp, PAC to try and have CT done today or tomorrow if possible.

## 2016-05-09 NOTE — Progress Notes (Signed)
Cardiology Office Note:    Date:  05/09/2016   ID:  [name], DOB 1955/12/27, MRN [id number]  PCP:  Tula Nakayama  Cardiologist:  Dr. Dorris Carnes (last seen in 2012) >> requests Dr. Jenkins Rouge (husband's doctor) Electrophysiologist:  n/a  Referring MD: No ref. provider found   Chief Complaint  Patient presents with  . Chest Pain    Referred by PCP Bing Matter, PA-C    History of Present Illness:    [name] is a 60 y.o. female with a hx of HLD, asthma and questionable OSA.  Last seen by Dr. Dorris Carnes in 2012.  She has a hx of intol to Simvastatin and Rosuvastatin. Her husband is [name] who sees Dr. Johnsie Cancel in our practice.  Sen by PCP 05/03/16 with c/o's of shortness of breath, palpitations and chest pain.  Labs 05/03/16: Hgb 14.9, TSH 1.807, K 4.5, SCr 0.84, Ca 10.7, ALT 35.  CXR: no active cardiopulmonary disease. ECG was reportedly normal.   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  She travels a lot with the latter and just got back from [place].  While in [place], she had a massage and the masseuse was trained in [place].  He checked her and told her to see her doctor to check out her heart.  She saw her [place] instructor in the [place] and was told her O2 was 88%. She has noted her heart racing at times.  She has noted some difficulty breathing at times.  She has always thought this was related to asthma.  She denies any significant change in dyspnea on exertion.  She denies chest pain.  She denies orthopnea, PND, edema.  She has a chronic cough and wheezing with her asthma.  She denies any symptoms related to meals. She denies pleuritic chest pain.  Prior CV studies that were reviewed today include:    None   Past Medical History:  Diagnosis Date  . Asthma   . Chronic constipation   . Depression   . HLD (hyperlipidemia)     Past Surgical History:  Procedure Laterality Date  . ABDOMINAL HYSTERECTOMY    .  TONSILLECTOMY      Current Medications: Current Meds  Medication Sig  . albuterol (VENTOLIN HFA) 108 (90 BASE) MCG/ACT inhaler Inhale 2 puffs into the lungs every 6 (six) hours as needed for wheezing or shortness of breath.   Marland Kitchen atorvastatin (LIPITOR) 40 MG tablet Take 40 mg by mouth daily.  . CYMBALTA 60 MG capsule Take 1 tablet by mouth Daily.  . Diclofenac Sodium (VOLTAREN PO) Take by mouth. TAKE 1 TABLET BY MOUTH TWICE DAILY PATIENT NOT SURE OF THE DOSE  . Fluticasone-Salmeterol (ADVAIR) 100-50 MCG/DOSE AEPB Inhale 1 puff into the lungs as needed (SOB).      Allergies:   Doxycycline and Tetracycline   Social History   Social History  . Marital status: Married    Spouse name: [name]  . Number of children: [name]  . Years of education: [name]   Social History Main Topics  . Smoking status: Former Research scientist (life sciences)  . Smokeless tobacco: Never Used  . Alcohol use Yes     Comment: 1 drink a day  . Drug use: No  . Sexual activity: Not Asked   Other Topics Concern  . 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   Originally from [place], [place]     Family History:  The patient's family history includes Heart disease (age of onset: 31) in her father.   ROS:   Please see the history of present illness.    Review of Systems  Cardiovascular: Positive for dyspnea on exertion.  Respiratory: Positive for snoring.   Gastrointestinal: Positive for constipation.   All other systems reviewed and are negative.   EKGs/Labs/Other Test Reviewed:    EKG:  EKG is  ordered today.  The ekg ordered today demonstrates NSR, HR 88, normal axis, QTc 447 ms, no changes ECG from PCP dated 05/03/16 personally reviewed and demonstrates NSR, HR 93, normal axis, QTc 445 ms  Recent Labs: No results found for requested labs within last 8760 hours.   Recent Lipid Panel    Component Value Date/Time    CHOL 291 (H) 08/10/2011 1038   TRIG 135.0 08/10/2011 1038   HDL 76.00 08/10/2011 1038   CHOLHDL 4 08/10/2011 1038   VLDL 27.0 08/10/2011 1038   LDLDIRECT 189.1 08/10/2011 1038     Physical Exam:    VS:  BP 126/64   Pulse 88   Ht 5' 1.5" (1.562 m)   Wt 167 lb 12.8 oz (76.1 kg)   SpO2 99%   BMI 31.19 kg/m     Wt Readings from Last 3 Encounters:  05/09/16 167 lb 12.8 oz (76.1 kg)  04/20/11 157 lb (71.2 kg)  04/18/10 160 lb (72.6 kg)     Physical Exam  Constitutional: She is oriented to person, place, and time. She appears well-developed and well-nourished. No distress.  HENT:  Head: Normocephalic and atraumatic.  Eyes: No scleral icterus.  Neck: No JVD present. Carotid bruit is not present.  Cardiovascular: Normal rate, regular rhythm, S1 normal and S2 normal.   No murmur heard. Pulmonary/Chest: She has decreased breath sounds. She has no wheezes. She has no rhonchi. She has no rales.  Abdominal: Soft. There is no tenderness. There is no rebound.  Musculoskeletal: She exhibits no edema.  Neurological: She is alert and oriented to person, place, and time.  Skin: Skin is warm and dry.  Psychiatric: She has a normal mood and affect.    ASSESSMENT:    1. SOB (shortness of breath)   2. Palpitations   3. Pure hypercholesterolemia   4. Moderate persistent asthma without complication    PLAN:    In order of problems listed above:  1. Shortness of breath - Etiology not entirely clear and may all be related to asthma.  But, she does travel quite a bit and was apparently told her O2 was low at one point.  No S1Q3T3 noted on ECG. She has no chest pain.  She is not tachycardic.  But, pulmonary embolism should be ruled out.    -  Get DDimer, BNP  -  Arrange Echocardiogram to rule out structural heart disease  -  Arrange ETT-Echo to r/o ischemic heart disease  2. Palpitations - Arrange above testing.  If palpitations continue, consider event monitor.   3. Hyperlipidemia -  Managed by PCP.  4. Asthma - FU with PCP as planned.    Medication Adjustments/Labs and Tests Ordered: Current medicines are reviewed at length with the patient today.  Concerns regarding medicines are outlined above.  Medication changes, Labs and Tests ordered today are outlined in the Patient Instructions noted below. Patient Instructions  Medication Instructions:  Your physician recommends that you continue  on your current medications as directed. Please refer to the Current Medication list given to you today.  Labwork: TODAY BNP, STAT D-DIMER  Testing/Procedures: 1. Your physician has requested that you have an echocardiogram. Echocardiography is a painless test that uses sound waves to create images of your heart. It provides your doctor with information about the size and shape of your heart and how well your heart's chambers and valves are working. This procedure takes approximately one hour. There are no restrictions for this procedure.  2. Your physician has requested that you have a stress echocardiogram. For further information please visit [email or web address]. Please follow instruction sheet as given.  Follow-Up: 3-4 WEEKS WITH DR. Johnsie Cancel  Any Other Special Instructions Will Be Listed Below (If Applicable).  If you need a refill on your cardiac medications before your next appointment, please call your pharmacy.  Signed, Richardson Dopp, PA-C  05/09/2016 12:29 PM    [facility] [address], [place], [place]  [address] Phone: [phone]; Fax: [phone]   The patient has been seen in conjunction with Richardson Dopp, PA-C. All aspects of care have been considered and discussed. The patient has been personally interviewed, examined, and all clinical data has been reviewed.   Agree with the above assessment and plan for the problems evaluated. The patient requests Dr. Jenkins Rouge as her cardiologist if subsequent follow-up is needed.

## 2016-05-09 NOTE — Patient Instructions (Addendum)
Medication Instructions:  Your physician recommends that you continue on your current medications as directed. Please refer to the Current Medication list given to you today.  Labwork: TODAY BNP, STAT D-DIMER  Testing/Procedures: 1. Your physician has requested that you have an echocardiogram. Echocardiography is a painless test that uses sound waves to create images of your heart. It provides your doctor with information about the size and shape of your heart and how well your heart's chambers and valves are working. This procedure takes approximately one hour. There are no restrictions for this procedure.  2. Your physician has requested that you have a stress echocardiogram. For further information please visit [email or web address]. Please follow instruction sheet as given.  Follow-Up: 3-4 WEEKS WITH DR. Johnsie Cancel  Any Other Special Instructions Will Be Listed Below (If Applicable).  If you need a refill on your cardiac medications before your next appointment, please call your pharmacy.

## 2016-05-10 ENCOUNTER — Telehealth: Payer: Self-pay | Admitting: *Deleted

## 2016-05-10 LAB — BRAIN NATRIURETIC PEPTIDE

## 2016-05-10 NOTE — Telephone Encounter (Signed)
Pt notified of lab and Chest CT results and findings. Pt aware per Brynda Rim. PA she will need to f/u w/PCP in regards to CT results for further evaluation. Pt aware we will call PCP to get pt an appt and let he know of date and time. Pt said ok to lmom with information for appt w/PCP.  I asked pt since PCP office is closed right now, would she like for me to call on Friday when I am back in the office or would she prefer a Triage nurse call PCP tomorrow and get her an appt. Pt states she would like if a Triage nurse could please call PCP and get her an appt. I will send a message to Triage with this request. We will fax CT results to PCP Leonia Reader, PA-C.

## 2016-05-11 ENCOUNTER — Telehealth: Payer: Self-pay | Admitting: Physician Assistant

## 2016-05-11 NOTE — Telephone Encounter (Signed)
[place] opens at 0900.  [phone]

## 2016-05-11 NOTE — Telephone Encounter (Signed)
Appt sch at [facility] for today at 1410.  CT results faxed today to [phone]. I have attempted to contact patient numerous times without success.

## 2016-05-11 NOTE — Telephone Encounter (Signed)
Patient called back and states she can not make 1410 appt today w/ Bing Matter, PA-C @ [facility].  I gave her the number and advised her to call so they could work out a time that would work for her. She voiced understanding and agreed with the plan.

## 2016-05-11 NOTE — Telephone Encounter (Signed)
Follow Up:; ° ° °Returning your call. °

## 2016-05-15 ENCOUNTER — Other Ambulatory Visit: Payer: Self-pay | Admitting: Family Medicine

## 2016-05-15 DIAGNOSIS — E041 Nontoxic single thyroid nodule: Secondary | ICD-10-CM

## 2016-05-15 DIAGNOSIS — Z23 Encounter for immunization: Secondary | ICD-10-CM | POA: Diagnosis not present

## 2016-05-16 MED FILL — ATORVASTATIN 40 MG TABLET: 40 | 30 days supply | Qty: 30 | Fill #0

## 2016-05-16 MED FILL — DULoxetine HCL 60 MG CPEP: 60 | 90 days supply | Qty: 90 | Fill #2

## 2016-05-22 ENCOUNTER — Ambulatory Visit
Admission: RE | Admit: 2016-05-22 | Discharge: 2016-05-22 | Disposition: A | Discharge status: Home / Self Care | Payer: 59 | Source: Ambulatory Visit | Attending: Family Medicine | Admitting: Family Medicine

## 2016-05-22 DIAGNOSIS — E041 Nontoxic single thyroid nodule: Secondary | ICD-10-CM

## 2016-05-25 ENCOUNTER — Other Ambulatory Visit: Payer: Self-pay | Admitting: Family Medicine

## 2016-05-25 ENCOUNTER — Telehealth (HOSPITAL_COMMUNITY): Payer: Self-pay

## 2016-05-25 DIAGNOSIS — E041 Nontoxic single thyroid nodule: Secondary | ICD-10-CM

## 2016-05-29 ENCOUNTER — Telehealth (HOSPITAL_COMMUNITY): Payer: Self-pay | Admitting: *Deleted

## 2016-05-29 NOTE — Telephone Encounter (Signed)
Patient given detailed instructions per Stress Test Requisition Sheet for test on 05/30/16 at 2:30.Patient Notified to arrive 30 minutes early, and that it is imperative to arrive on time for appointment to keep from having the test rescheduled.  Patient verbalized understanding. [name]

## 2016-05-30 ENCOUNTER — Encounter: Payer: Self-pay | Admitting: Physician Assistant

## 2016-05-30 ENCOUNTER — Ambulatory Visit (HOSPITAL_COMMUNITY): Payer: 59 | Attending: Cardiology

## 2016-05-30 ENCOUNTER — Ambulatory Visit (HOSPITAL_BASED_OUTPATIENT_CLINIC_OR_DEPARTMENT_OTHER): Payer: 59

## 2016-05-30 ENCOUNTER — Other Ambulatory Visit: Payer: Self-pay

## 2016-05-30 ENCOUNTER — Telehealth: Payer: Self-pay | Admitting: *Deleted

## 2016-05-30 DIAGNOSIS — R002 Palpitations: Secondary | ICD-10-CM

## 2016-05-30 DIAGNOSIS — R0989 Other specified symptoms and signs involving the circulatory and respiratory systems: Secondary | ICD-10-CM

## 2016-05-30 DIAGNOSIS — R0602 Shortness of breath: Secondary | ICD-10-CM

## 2016-05-30 NOTE — Telephone Encounter (Signed)
Pt has been notified of both stress echo and echo results by phone with verbal understanding.

## 2016-06-02 NOTE — Progress Notes (Signed)
Cardiology Office Note    Date:  06/07/2016   ID:  [name], DOB 23-Jun-1956, MRN [id number]  PCP:  [name]  Cardiologist:  Dr. Dorris Carnes (last seen in 2012) >> requests Dr. Jenkins Rouge (husband's doctor)  CC: follow up  History of Present Illness:  [name] is a 60 y.o. female with a history of HLD, asthma and questionable OSA who presents to clinic for evaluation of SOB.     Last seen by Dr. Dorris Carnes in 2012.  She has a hx of intol to Simvastatin and Rosuvastatin. Her husband is [name] who sees Dr. Johnsie Cancel in our practice.  Sen by PCP 05/03/16 with c/o's of shortness of breath, palpitations and chest pain.  Labs 05/03/16: Hgb 14.9, TSH 1.807, K 4.5, SCr 0.84, Ca 10.7, ALT 35.  CXR: no active cardiopulmonary disease. ECG was reportedly normal.   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  She travels a lot with the latter and just got back from [place].  While in [place], she had a massage and the masseuse was trained in [place]. He checked her and told her to see her doctor to check out her heart.  She saw her [facility] instructor in the [place] and was told her O2 was 88% and noted her heart racing at times.   She saw Heidi Dopp PA-C in the office on 05/09/16 for cardiac evaluation. He ordered a 2D ECHO with showed normal LV function, G1DD and stress echocardiogram which was negative for ischemia. BNP was normal but D Dimer mildly elevated. Subsequent CT angio showed no PE but did show cysts on her liver and a R thyroid nodule. She was set up with PCP for further testing.   Today she presents to clinic for follow up. She has her thyroid biopsy yesterday. She has not been sleeping well and feels ramped up today. Her chest feels a little tight. She lays in bed at night and sometimes can feel hear her heart beat. She travels to [place] every 2 weeks. No chest pain or SOB. No LE edema, orthopnea or PND. No dizziness or syncope. No blood in  stool or urine.    Past Medical History:  Diagnosis Date  . Asthma   . Chronic constipation   . Depression   . History of echocardiogram    a. Echo 10/17: Mild concentric LVH, vigorous LVF, EF 65-70, normal wall motion, grade 1 diastolic dysfunction, GLS -24.4%,  . History of stress test    a. ETT-Echo 10/17: normal  . HLD (hyperlipidemia)     Past Surgical History:  Procedure Laterality Date  . ABDOMINAL HYSTERECTOMY    . TONSILLECTOMY      Current Medications: Outpatient Medications Prior to Visit  Medication Sig Dispense Refill  . albuterol (VENTOLIN HFA) 108 (90 BASE) MCG/ACT inhaler Inhale 2 puffs into the lungs every 6 (six) hours as needed for wheezing or shortness of breath.     Marland Kitchen atorvastatin (LIPITOR) 40 MG tablet Take 40 mg by mouth daily.    . CYMBALTA 60 MG capsule Take 60 mg by mouth daily.     . Diclofenac Sodium (VOLTAREN PO) Take by mouth. TAKE 1 TABLET BY MOUTH TWICE DAILY PATIENT NOT SURE OF THE DOSE    . Fluticasone-Salmeterol (ADVAIR) 100-50 MCG/DOSE AEPB Inhale 1 puff into the lungs as needed (SOB).      No facility-administered medications prior to visit.  Allergies:   Doxycycline and Tetracycline   Social History   Social History  . Marital status: Married    Spouse name: [name]  . Number of children: [name]  . Years of education: [name]   Social History Main Topics  . Smoking status: Former Research scientist (life sciences)  . Smokeless tobacco: Never Used  . Alcohol use Yes     Comment: 1 drink a day  . Drug use: No  . Sexual activity: Not Asked   Other Topics Concern  . 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   Originally from [place], [place]     Family History:  The patient's family history includes Heart disease (age of onset: 10) in her father.     ROS:   Please see the history of present illness.    ROS All other systems reviewed and are  negative.   PHYSICAL EXAM:   VS:  BP 116/84   Pulse 94   Ht _0  (1.575 m)   Wt 167 lb (75.8 kg)   BMI 30.54 kg/m    GEN: Well nourished, well developed, in no acute distress  HEENT: normal  Neck: no JVD, carotid bruits, or masses Cardiac: RRR; no murmurs, rubs, or gallops,no edema  Respiratory:  clear to auscultation bilaterally, normal work of breathing GI: soft, nontender, nondistended, + BS MS: no deformity or atrophy  Skin: warm and dry, no rash Neuro:  Alert and Oriented x 3, Strength and sensation are intact Psych: euthymic mood, full affect  Wt Readings from Last 3 Encounters:  06/07/16 167 lb (75.8 kg)  05/09/16 167 lb 12.8 oz (76.1 kg)  04/20/11 157 lb (71.2 kg)      Studies/Labs Reviewed:   EKG:  EKG is NOT ordered today.    Recent Labs: 05/09/2016: Brain Natriuretic Peptide <4.0   Lipid Panel    Component Value Date/Time   CHOL 291 (H) 08/10/2011 1038   TRIG 135.0 08/10/2011 1038   HDL 76.00 08/10/2011 1038   CHOLHDL 4 08/10/2011 1038   VLDL 27.0 08/10/2011 1038   LDLDIRECT 189.1 08/10/2011 1038    Additional studies/ records that were reviewed today include:  2D ECHO: 05/30/2016 LV EF: 65% -   70% Study Conclusions - Left ventricle: The cavity size was normal. There was mild   concentric hypertrophy. Systolic function was vigorous. The   estimated ejection fraction was in the range of 65% to 70%. Wall   motion was normal; there were no regional wall motion   abnormalities. Doppler parameters are consistent with abnormal   left ventricular relaxation (grade 1 diastolic dysfunction).   Global longitudinal strain: -20.4%  Stress echo 05/30/16 Impressions: - Stress echocardiogram with no chest pain, no ST changes and no   stress-induced wall motion abnormalities; normal stress   echocardiogram.  ASSESSMENT & PLAN:   1. Shortness of breath - 2D ECHO normal and BNP normal. CTA with PE. Stress echo normal. Feeling better, thinks she just has a  lot of stress in her life. No further work up.   2. Palpitations - has not had palpitation recently. No further work up  3. Hyperlipidemia - Managed by PCP.  4. Asthma - FU with PCP as planned.   5. Thyroid nodule: s/p biopsy yesterday. TSH has been normal   Medication Adjustments/Labs and Tests Ordered: Current medicines are reviewed at length with  the patient today.  Concerns regarding medicines are outlined above.  Medication changes, Labs and Tests ordered today are listed in the Patient Instructions below. Patient Instructions  Medication Instructions:  Your physician recommends that you continue on your current medications as directed. Please refer to the Current Medication list given to you today.   Labwork: None ordered  Testing/Procedures: None ordered  Follow-Up: Your physician wants you to follow-up in: [place] Singer will receive a reminder letter in the mail two months in advance. If you don't receive a letter, please call our office to schedule the follow-up appointment.   Any Other Special Instructions Will Be Listed Below (If Applicable).     If you need a refill on your cardiac medications before your next appointment, please call your pharmacy.      Signed, Angelena Form, PA-C  06/07/2016 9:57 AM    [facility] [address], [place], [place]  [address] Phone: [phone]; Fax: [phone]

## 2016-06-06 ENCOUNTER — Ambulatory Visit
Admission: RE | Admit: 2016-06-06 | Discharge: 2016-06-06 | Disposition: A | Discharge status: Home / Self Care | Payer: 59 | Source: Ambulatory Visit | Attending: Family Medicine | Admitting: Family Medicine

## 2016-06-06 ENCOUNTER — Other Ambulatory Visit (HOSPITAL_COMMUNITY)
Admission: RE | Admit: 2016-06-06 | Discharge: 2016-06-06 | Disposition: A | Discharge status: Home / Self Care | Payer: 59 | Source: Ambulatory Visit | Attending: Radiology | Admitting: Radiology

## 2016-06-06 DIAGNOSIS — E041 Nontoxic single thyroid nodule: Secondary | ICD-10-CM

## 2016-06-06 MED FILL — ATORVASTATIN 40 MG TABLET: 40 | 90 days supply | Qty: 90 | Fill #0

## 2016-06-07 ENCOUNTER — Encounter: Payer: Self-pay | Admitting: Physician Assistant

## 2016-06-07 ENCOUNTER — Ambulatory Visit (INDEPENDENT_AMBULATORY_CARE_PROVIDER_SITE_OTHER): Payer: 59 | Admitting: Physician Assistant

## 2016-06-07 VITALS — BP 116/84 | HR 94 | Ht 62.0 in | Wt 167.0 lb

## 2016-06-07 DIAGNOSIS — F419 Anxiety disorder, unspecified: Secondary | ICD-10-CM | POA: Diagnosis not present

## 2016-06-07 DIAGNOSIS — E7800 Pure hypercholesterolemia, unspecified: Secondary | ICD-10-CM

## 2016-06-07 DIAGNOSIS — E041 Nontoxic single thyroid nodule: Secondary | ICD-10-CM | POA: Diagnosis not present

## 2016-06-07 NOTE — Patient Instructions (Signed)
Medication Instructions:  Your physician recommends that you continue on your current medications as directed. Please refer to the Current Medication list given to you today.   Labwork: None ordered  Testing/Procedures: None ordered  Follow-Up: Your physician wants you to follow-up in: 1 YEAR WITH DR. NISHAN   You will receive a reminder letter in the mail two months in advance. If you don't receive a letter, please call our office to schedule the follow-up appointment.   Any Other Special Instructions Will Be Listed Below (If Applicable).     If you need a refill on your cardiac medications before your next appointment, please call your pharmacy.   

## 2016-08-09 MED FILL — DULoxetine HCL 60 MG CPEP: 60 | 90 days supply | Qty: 90 | Fill #3

## 2016-09-07 MED FILL — ATORVASTATIN 40 MG TABLET: 40 | 90 days supply | Qty: 90 | Fill #0

## 2016-09-11 DIAGNOSIS — H5213 Myopia, bilateral: Secondary | ICD-10-CM | POA: Diagnosis not present

## 2016-11-21 DIAGNOSIS — Z1231 Encounter for screening mammogram for malignant neoplasm of breast: Secondary | ICD-10-CM | POA: Diagnosis not present

## 2016-11-21 DIAGNOSIS — Z13820 Encounter for screening for osteoporosis: Secondary | ICD-10-CM | POA: Diagnosis not present

## 2016-11-21 DIAGNOSIS — Z01419 Encounter for gynecological examination (general) (routine) without abnormal findings: Secondary | ICD-10-CM | POA: Diagnosis not present

## 2016-11-21 DIAGNOSIS — Z6831 Body mass index (BMI) 31.0-31.9, adult: Secondary | ICD-10-CM | POA: Diagnosis not present

## 2016-11-21 MED FILL — DULoxetine HCL 60 MG CPEP: 60 | 90 days supply | Qty: 90 | Fill #0

## 2017-01-17 MED FILL — RESTASIS 0.05% EYE EMULSION: 0.05 | 90 days supply | Qty: 180 | Fill #1

## 2017-01-17 MED FILL — ATORVASTATIN 40 MG TABLET: 40 | 30 days supply | Qty: 30 | Fill #0

## 2017-02-27 MED FILL — ATORVASTATIN 40 MG TABLET: 40 | 30 days supply | Qty: 30 | Fill #0

## 2017-03-02 MED FILL — DULoxetine HCL 60 MG CPEP: 60 | 90 days supply | Qty: 90 | Fill #1

## 2017-03-09 DIAGNOSIS — E785 Hyperlipidemia, unspecified: Secondary | ICD-10-CM | POA: Diagnosis not present

## 2017-03-09 DIAGNOSIS — R7301 Impaired fasting glucose: Secondary | ICD-10-CM | POA: Diagnosis not present

## 2017-03-27 DIAGNOSIS — J45909 Unspecified asthma, uncomplicated: Secondary | ICD-10-CM | POA: Diagnosis not present

## 2017-03-27 DIAGNOSIS — J22 Unspecified acute lower respiratory infection: Secondary | ICD-10-CM | POA: Diagnosis not present

## 2017-03-27 DIAGNOSIS — Z23 Encounter for immunization: Secondary | ICD-10-CM | POA: Diagnosis not present

## 2017-03-27 DIAGNOSIS — E785 Hyperlipidemia, unspecified: Secondary | ICD-10-CM | POA: Diagnosis not present

## 2017-03-27 MED FILL — AMOX TR-K CLV 875-125 MG TA: 875-125 | 10 days supply | Qty: 20 | Fill #0

## 2017-03-27 MED FILL — ATORVASTATIN 40 MG TABLET: 40 | 90 days supply | Qty: 90 | Fill #0

## 2017-03-27 MED FILL — FLUTICASONE PROP 50 MCG SPR: 50 | 90 days supply | Qty: 48 | Fill #0

## 2017-06-13 MED FILL — DULoxetine HCL 60 MG CPEP: 60 | 90 days supply | Qty: 90 | Fill #2

## 2017-06-26 MED FILL — ATORVASTATIN 40 MG TABLET: 40 | 90 days supply | Qty: 90 | Fill #1

## 2017-09-05 IMAGING — CT CT ANGIO CHEST
2 of 8 series · 18 of 46 positions shown · IV contrast (ISOVUE 370)
Comparison: Chest radiograph 05/03/2016

CLINICAL DATA: 60-year-old with positive D-dimer and shortness of
breath. Recent travel to [place].

EXAM:
CT ANGIOGRAPHY CHEST WITH CONTRAST
TECHNIQUE: Multidetector CT imaging of the chest was performed using the
standard protocol during bolus administration of intravenous
contrast. Multiplanar CT image reconstructions and MIPs were
obtained to evaluate the vascular anatomy.
CONTRAST:  80 mL Isovue 370

[Series 5: thins · axial · 0.66mm/px · z∈[-265,-17]mm · 15 of 274 slices shown]
[im 13/274  lung]
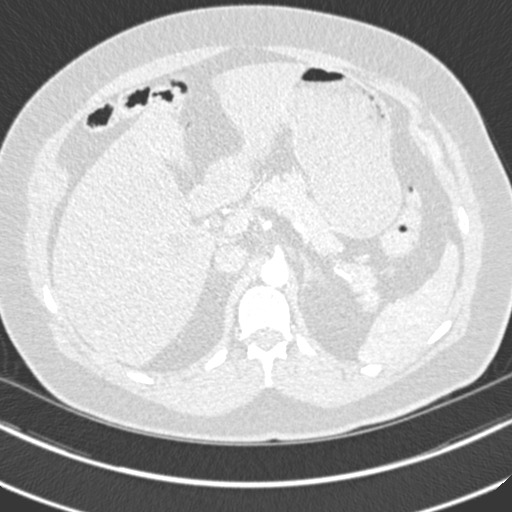
[im 38/274  soft-tissue]
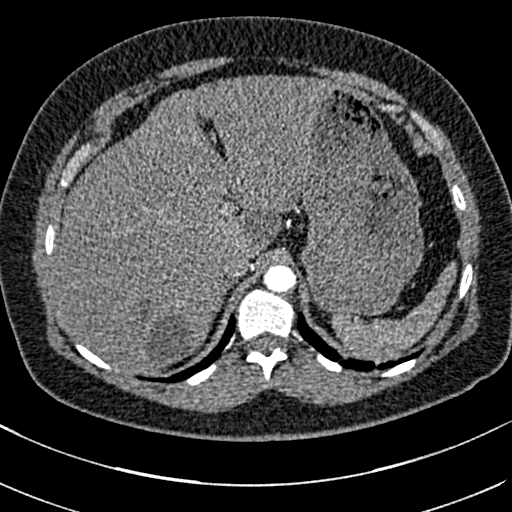
[im 50/274  lung]
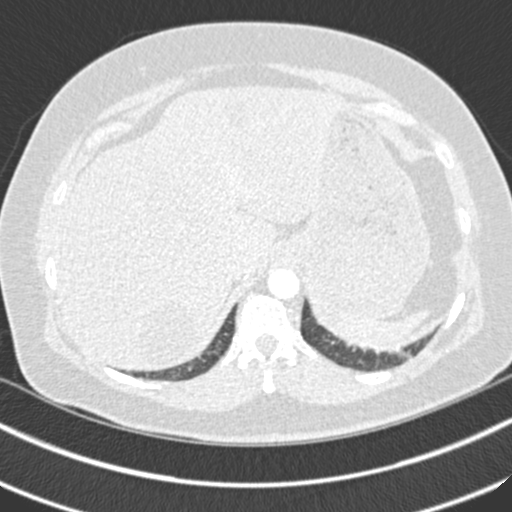
[im 63/274  soft-tissue]
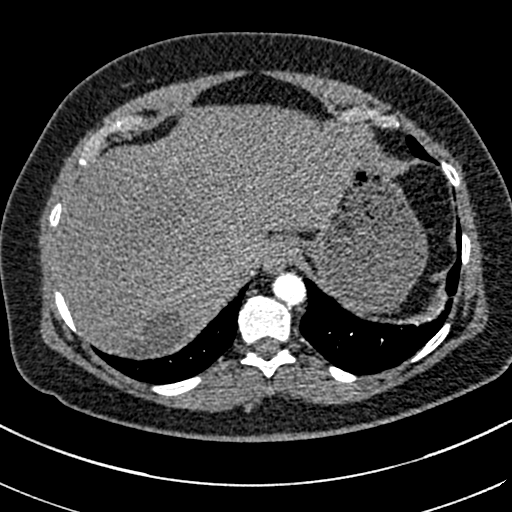
[im 87/274  lung]
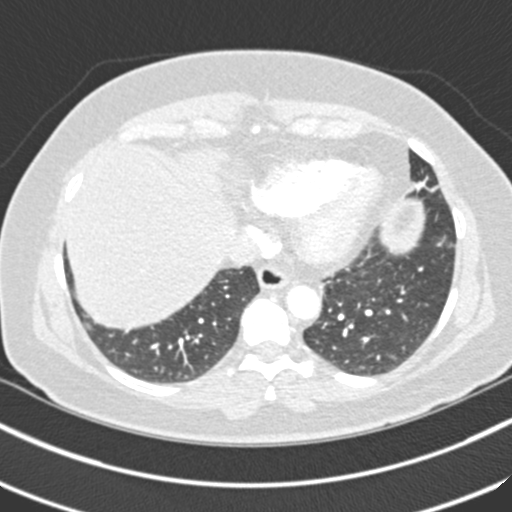
[im 100/274  soft-tissue]
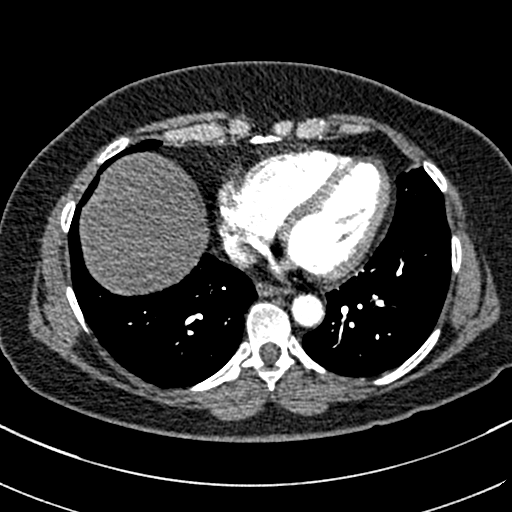
[im 125/274  lung]
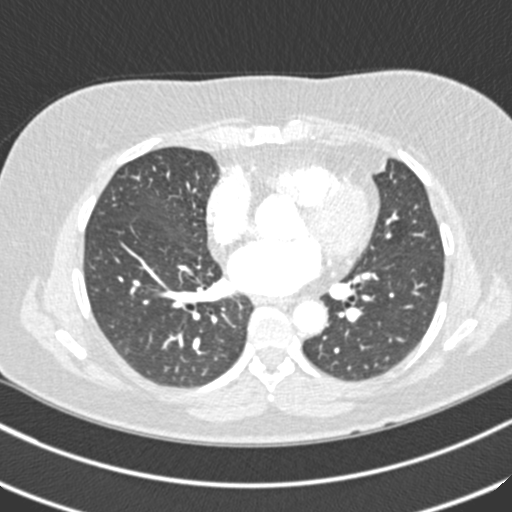
[im 137/274  soft-tissue]
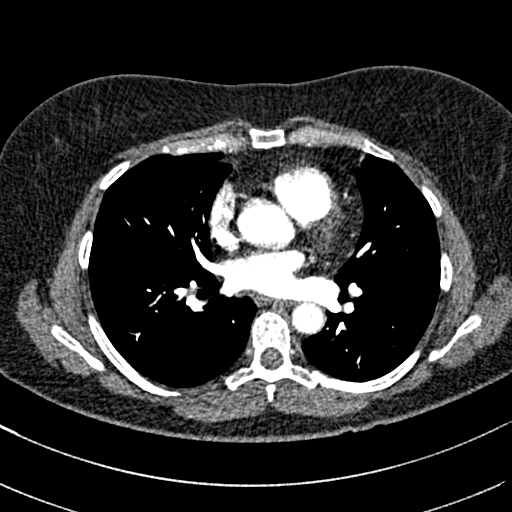
[im 149/274  lung]
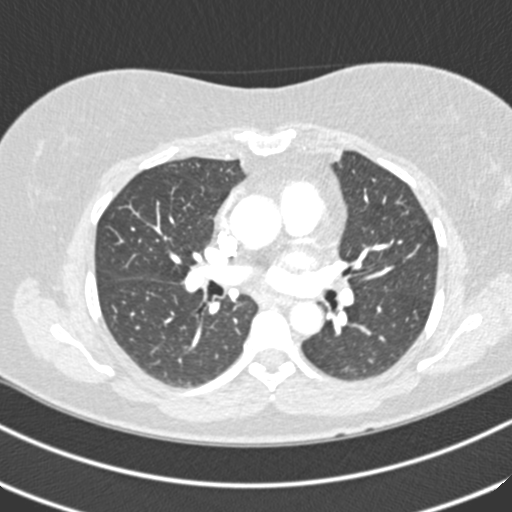
[im 174/274  soft-tissue]
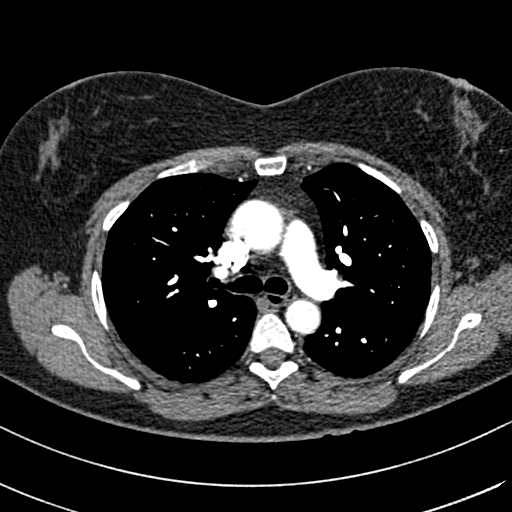
[im 187/274  lung]
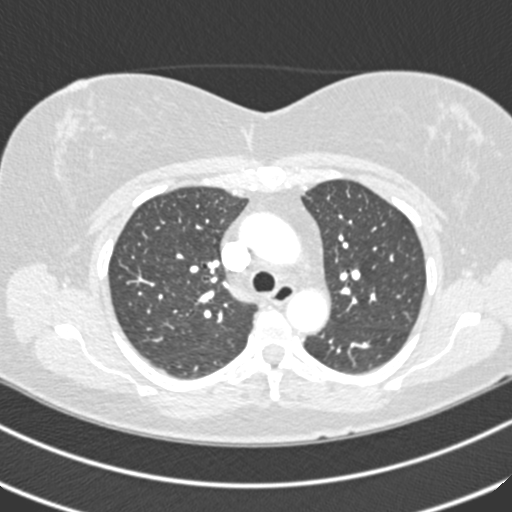
[im 211/274  soft-tissue]
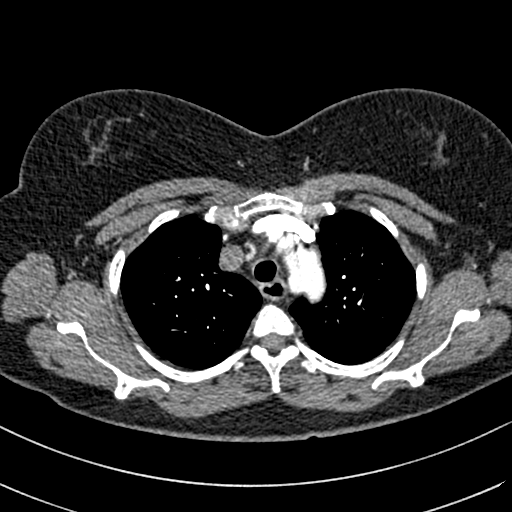
[im 224/274  lung]
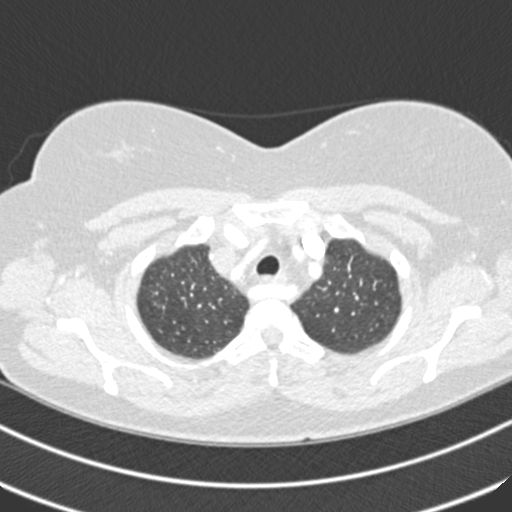
[im 236/274  soft-tissue]
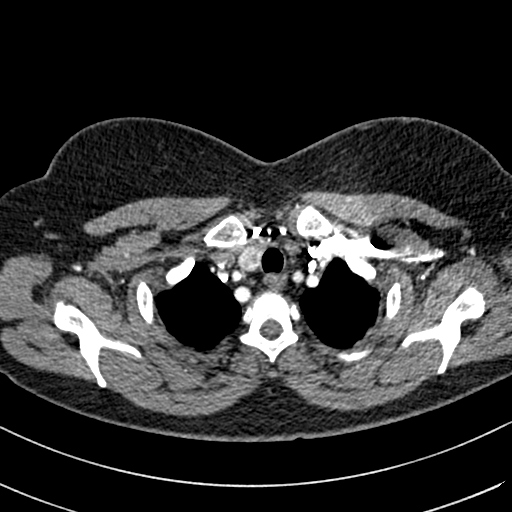
[im 261/274  lung]
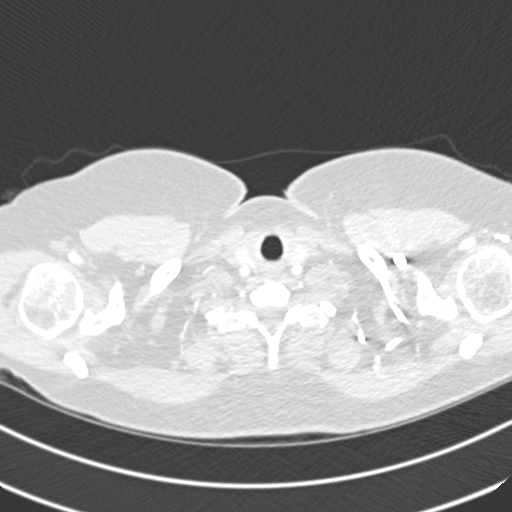

[Series 7: coronal mpr · coronal · 0.59mm/px · 3 of 119 slices shown]
[im 30/119  soft-tissue]
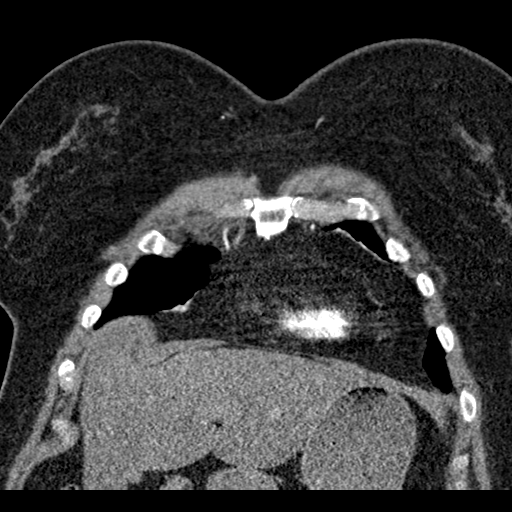
[im 60/119  soft-tissue]
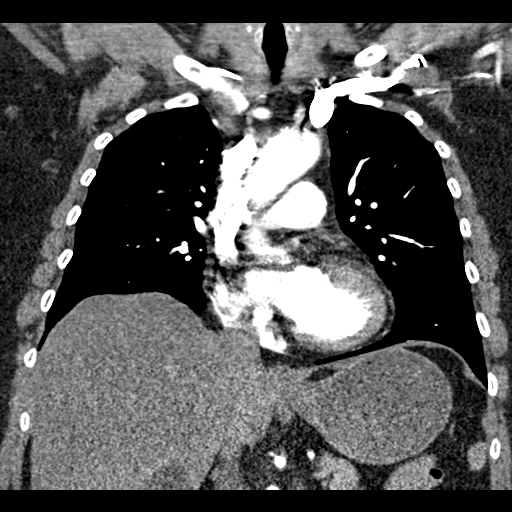
[im 89/119  soft-tissue]
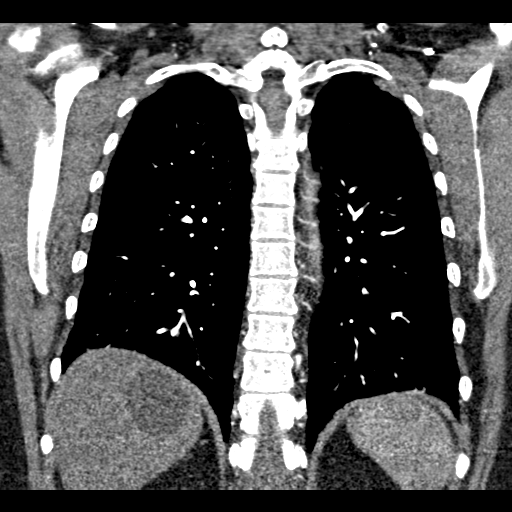

[18 of 46 positions shown; findings below may reference images not displayed]

FINDINGS: Cardiovascular: Pulmonary arteries are adequately opacified on this
examination. There is no evidence to suggest a pulmonary embolism.
There is an aberrant right subclavian artery. This right subclavian
artery extends posterior to the esophagus. There is no significant
aneurysm at the origin of the right subclavian artery. Great vessels
are patent.

Mediastinum/Nodes: Inferior right thyroid lobe is heterogeneous and
there may be a 1.6 cm nodule. No lymph node enlargement in the
mediastinum, hila or axillary regions. There is no significant
pericardial fluid. Esophagus is unremarkable.

Lungs/Pleura: Trachea and mainstem bronchi are patent. Both lungs
are clear. No airspace disease or lung consolidation. No pleural
effusions.

Upper Abdomen: Multiple low-density structures in the liver. These
are incompletely evaluated on this arterial phase imaging but favor
hepatic cysts. The largest measures 4.8 cm along the posterior right
hepatic lobe.

Musculoskeletal: No acute abnormality.

Review of the MIP images confirms the above findings.
IMPRESSION: Negative for pulmonary embolism.

No acute chest abnormality.

Numerous low-density structures throughout the liver, largest
measures up to 4.8 cm. These may represent cysts but not completely
characterized. Liver cysts could be confirmed with an abdominal
ultrasound.

Right thyroid lobe is heterogeneous and there may be a 1.6 cm
nodule. Recommend a dedicated thyroid ultrasound.

Aberrant right subclavian artery without aneurysm.

## 2017-09-05 MED FILL — DULoxetine HCL 60 MG CPEP: 60 | 90 days supply | Qty: 90 | Fill #3

## 2017-09-24 DIAGNOSIS — H35371 Puckering of macula, right eye: Secondary | ICD-10-CM | POA: Diagnosis not present

## 2017-10-02 MED FILL — ATORVASTATIN 40 MG TABLET: 40 | 90 days supply | Qty: 90 | Fill #2

## 2017-10-25 DIAGNOSIS — D485 Neoplasm of uncertain behavior of skin: Secondary | ICD-10-CM | POA: Diagnosis not present

## 2017-10-25 DIAGNOSIS — L820 Inflamed seborrheic keratosis: Secondary | ICD-10-CM | POA: Diagnosis not present

## 2017-11-22 DIAGNOSIS — Z1231 Encounter for screening mammogram for malignant neoplasm of breast: Secondary | ICD-10-CM | POA: Diagnosis not present

## 2017-11-22 DIAGNOSIS — Z6830 Body mass index (BMI) 30.0-30.9, adult: Secondary | ICD-10-CM | POA: Diagnosis not present

## 2017-11-22 DIAGNOSIS — Z01419 Encounter for gynecological examination (general) (routine) without abnormal findings: Secondary | ICD-10-CM | POA: Diagnosis not present

## 2017-12-10 MED FILL — DULoxetine HCL 60 MG CPEP: 60 | 90 days supply | Qty: 90 | Fill #0

## 2018-01-01 MED FILL — ATORVASTATIN 40 MG TABLET: 40 | 90 days supply | Qty: 90 | Fill #3

## 2018-02-26 DIAGNOSIS — J4521 Mild intermittent asthma with (acute) exacerbation: Secondary | ICD-10-CM | POA: Diagnosis not present

## 2018-02-26 DIAGNOSIS — E782 Mixed hyperlipidemia: Secondary | ICD-10-CM | POA: Diagnosis not present

## 2018-02-26 DIAGNOSIS — F330 Major depressive disorder, recurrent, mild: Secondary | ICD-10-CM | POA: Diagnosis not present

## 2018-02-26 DIAGNOSIS — Z6830 Body mass index (BMI) 30.0-30.9, adult: Secondary | ICD-10-CM | POA: Diagnosis not present

## 2018-02-26 DIAGNOSIS — H938X2 Other specified disorders of left ear: Secondary | ICD-10-CM | POA: Diagnosis not present

## 2018-02-26 DIAGNOSIS — W5501XA Bitten by cat, initial encounter: Secondary | ICD-10-CM | POA: Diagnosis not present

## 2018-02-26 DIAGNOSIS — J9801 Acute bronchospasm: Secondary | ICD-10-CM | POA: Diagnosis not present

## 2018-02-26 MED FILL — AMOX-CLAV 875-125 MG TABLET: 875-125 | 14 days supply | Qty: 28 | Fill #0

## 2018-02-26 MED FILL — predniSONE 10 MG TABS: 10 | 3 days supply | Qty: 6 | Fill #0

## 2018-02-27 DIAGNOSIS — J4521 Mild intermittent asthma with (acute) exacerbation: Secondary | ICD-10-CM | POA: Diagnosis not present

## 2018-02-27 DIAGNOSIS — F330 Major depressive disorder, recurrent, mild: Secondary | ICD-10-CM | POA: Diagnosis not present

## 2018-02-27 DIAGNOSIS — E782 Mixed hyperlipidemia: Secondary | ICD-10-CM | POA: Diagnosis not present

## 2018-02-27 DIAGNOSIS — W5501XA Bitten by cat, initial encounter: Secondary | ICD-10-CM | POA: Diagnosis not present

## 2018-02-27 DIAGNOSIS — Z6830 Body mass index (BMI) 30.0-30.9, adult: Secondary | ICD-10-CM | POA: Diagnosis not present

## 2018-02-28 DIAGNOSIS — H52223 Regular astigmatism, bilateral: Secondary | ICD-10-CM | POA: Diagnosis not present

## 2018-02-28 DIAGNOSIS — H524 Presbyopia: Secondary | ICD-10-CM | POA: Diagnosis not present

## 2018-02-28 DIAGNOSIS — H5213 Myopia, bilateral: Secondary | ICD-10-CM | POA: Diagnosis not present

## 2018-02-28 DIAGNOSIS — H35371 Puckering of macula, right eye: Secondary | ICD-10-CM | POA: Diagnosis not present

## 2018-03-14 MED FILL — ATORVASTATIN 40 MG TABLET: 40 | 90 days supply | Qty: 90 | Fill #0

## 2018-03-14 MED FILL — DULoxetine HCL 60 MG CPEP: 60 | 90 days supply | Qty: 90 | Fill #1

## 2018-06-10 MED FILL — ATORVASTATIN 40 MG TABLET: 40 | 90 days supply | Qty: 90 | Fill #1

## 2018-06-10 MED FILL — DULoxetine HCL 60 MG CPEP: 60 | 90 days supply | Qty: 90 | Fill #2

## 2018-07-29 DIAGNOSIS — L821 Other seborrheic keratosis: Secondary | ICD-10-CM | POA: Diagnosis not present

## 2018-07-29 DIAGNOSIS — L918 Other hypertrophic disorders of the skin: Secondary | ICD-10-CM | POA: Diagnosis not present

## 2018-07-29 DIAGNOSIS — D485 Neoplasm of uncertain behavior of skin: Secondary | ICD-10-CM | POA: Diagnosis not present

## 2018-07-29 DIAGNOSIS — L738 Other specified follicular disorders: Secondary | ICD-10-CM | POA: Diagnosis not present

## 2018-07-29 DIAGNOSIS — C44311 Basal cell carcinoma of skin of nose: Secondary | ICD-10-CM | POA: Diagnosis not present

## 2018-07-29 DIAGNOSIS — D225 Melanocytic nevi of trunk: Secondary | ICD-10-CM | POA: Diagnosis not present

## 2018-07-29 DIAGNOSIS — D2262 Melanocytic nevi of left upper limb, including shoulder: Secondary | ICD-10-CM | POA: Diagnosis not present

## 2018-08-29 DIAGNOSIS — Z23 Encounter for immunization: Secondary | ICD-10-CM | POA: Diagnosis not present

## 2018-08-29 DIAGNOSIS — E782 Mixed hyperlipidemia: Secondary | ICD-10-CM | POA: Diagnosis not present

## 2018-08-29 DIAGNOSIS — Z0000 Encounter for general adult medical examination without abnormal findings: Secondary | ICD-10-CM | POA: Diagnosis not present

## 2018-08-29 DIAGNOSIS — F330 Major depressive disorder, recurrent, mild: Secondary | ICD-10-CM | POA: Diagnosis not present

## 2018-08-29 MED FILL — VIVOTIF EC CAPSULE: 7 days supply | Qty: 4 | Fill #0

## 2018-09-02 DIAGNOSIS — Z85828 Personal history of other malignant neoplasm of skin: Secondary | ICD-10-CM | POA: Diagnosis not present

## 2018-09-02 DIAGNOSIS — C44311 Basal cell carcinoma of skin of nose: Secondary | ICD-10-CM | POA: Diagnosis not present

## 2018-09-02 MED FILL — CLINDAMYCIN HCL 300 MG CAPS: 300 | 5 days supply | Qty: 15 | Fill #0

## 2018-09-13 MED FILL — DULOXETINE HCL 60 MG CPEP: 60 | 90 days supply | Qty: 90 | Fill #3

## 2018-09-27 DIAGNOSIS — Z23 Encounter for immunization: Secondary | ICD-10-CM | POA: Diagnosis not present

## 2018-11-21 MED FILL — DULOXETINE HCL 60 MG CPEP: 60 | 90 days supply | Qty: 90 | Fill #0

## 2018-11-21 MED FILL — ATORVASTATIN 40 MG TABLET: 40 | 90 days supply | Qty: 90 | Fill #0

## 2019-02-17 DIAGNOSIS — Z6832 Body mass index (BMI) 32.0-32.9, adult: Secondary | ICD-10-CM | POA: Diagnosis not present

## 2019-02-17 DIAGNOSIS — Z1231 Encounter for screening mammogram for malignant neoplasm of breast: Secondary | ICD-10-CM | POA: Diagnosis not present

## 2019-02-17 DIAGNOSIS — N952 Postmenopausal atrophic vaginitis: Secondary | ICD-10-CM | POA: Diagnosis not present

## 2019-02-17 DIAGNOSIS — Z01419 Encounter for gynecological examination (general) (routine) without abnormal findings: Secondary | ICD-10-CM | POA: Diagnosis not present

## 2019-03-06 DIAGNOSIS — Z23 Encounter for immunization: Secondary | ICD-10-CM | POA: Diagnosis not present

## 2019-03-12 MED FILL — ATORVASTATIN 40 MG TABLET: 40 | 30 days supply | Qty: 30 | Fill #0

## 2019-03-12 MED FILL — DULOXETINE HCL 60 MG CPEP: 60 | 90 days supply | Qty: 90 | Fill #1

## 2019-04-30 MED FILL — ATORVASTATIN 40 MG TABLET: 40 | 90 days supply | Qty: 90 | Fill #0

## 2019-04-30 MED FILL — ALBUTEROL SULFATE HFA 108 (: 108 (90 BAS | 75 days supply | Qty: 54 | Fill #0

## 2019-05-05 DIAGNOSIS — H35371 Puckering of macula, right eye: Secondary | ICD-10-CM | POA: Diagnosis not present

## 2019-06-09 MED FILL — DULOXETINE HCL 60 MG CPEP: 60 | 90 days supply | Qty: 90 | Fill #0

## 2019-09-02 DIAGNOSIS — D220 Melanocytic nevi of lip: Secondary | ICD-10-CM | POA: Diagnosis not present

## 2019-09-02 DIAGNOSIS — L821 Other seborrheic keratosis: Secondary | ICD-10-CM | POA: Diagnosis not present

## 2019-09-02 DIAGNOSIS — D1801 Hemangioma of skin and subcutaneous tissue: Secondary | ICD-10-CM | POA: Diagnosis not present

## 2019-09-02 DIAGNOSIS — D2239 Melanocytic nevi of other parts of face: Secondary | ICD-10-CM | POA: Diagnosis not present

## 2019-09-02 DIAGNOSIS — L820 Inflamed seborrheic keratosis: Secondary | ICD-10-CM | POA: Diagnosis not present

## 2019-09-02 DIAGNOSIS — D225 Melanocytic nevi of trunk: Secondary | ICD-10-CM | POA: Diagnosis not present

## 2019-09-02 DIAGNOSIS — D2272 Melanocytic nevi of left lower limb, including hip: Secondary | ICD-10-CM | POA: Diagnosis not present

## 2019-09-02 DIAGNOSIS — Z85828 Personal history of other malignant neoplasm of skin: Secondary | ICD-10-CM | POA: Diagnosis not present

## 2019-09-02 DIAGNOSIS — D485 Neoplasm of uncertain behavior of skin: Secondary | ICD-10-CM | POA: Diagnosis not present

## 2019-09-04 DIAGNOSIS — K219 Gastro-esophageal reflux disease without esophagitis: Secondary | ICD-10-CM | POA: Diagnosis not present

## 2019-09-04 DIAGNOSIS — E782 Mixed hyperlipidemia: Secondary | ICD-10-CM | POA: Diagnosis not present

## 2019-09-04 DIAGNOSIS — R739 Hyperglycemia, unspecified: Secondary | ICD-10-CM | POA: Diagnosis not present

## 2019-09-04 DIAGNOSIS — E1169 Type 2 diabetes mellitus with other specified complication: Secondary | ICD-10-CM | POA: Diagnosis not present

## 2019-09-04 DIAGNOSIS — K5900 Constipation, unspecified: Secondary | ICD-10-CM | POA: Diagnosis not present

## 2019-09-04 DIAGNOSIS — F330 Major depressive disorder, recurrent, mild: Secondary | ICD-10-CM | POA: Diagnosis not present

## 2019-09-04 MED FILL — DULOXETINE HCL 60 MG CPEP: 60 | 90 days supply | Qty: 90 | Fill #0

## 2019-09-08 DIAGNOSIS — Z20828 Contact with and (suspected) exposure to other viral communicable diseases: Secondary | ICD-10-CM | POA: Diagnosis not present

## 2019-09-10 DIAGNOSIS — K219 Gastro-esophageal reflux disease without esophagitis: Secondary | ICD-10-CM | POA: Diagnosis not present

## 2019-09-10 DIAGNOSIS — E782 Mixed hyperlipidemia: Secondary | ICD-10-CM | POA: Diagnosis not present

## 2019-09-10 DIAGNOSIS — R739 Hyperglycemia, unspecified: Secondary | ICD-10-CM | POA: Diagnosis not present

## 2019-09-10 DIAGNOSIS — K5900 Constipation, unspecified: Secondary | ICD-10-CM | POA: Diagnosis not present

## 2019-09-12 MED FILL — GLIPIZIDE ER 2.5 MG TB24: 2.5 | 90 days supply | Qty: 90 | Fill #0

## 2019-09-15 MED FILL — ATORVASTATIN 40 MG TABLET: 40 | 90 days supply | Qty: 90 | Fill #1

## 2019-10-13 DIAGNOSIS — Z20828 Contact with and (suspected) exposure to other viral communicable diseases: Secondary | ICD-10-CM | POA: Diagnosis not present

## 2020-03-17 DIAGNOSIS — N952 Postmenopausal atrophic vaginitis: Secondary | ICD-10-CM | POA: Diagnosis not present

## 2020-03-17 DIAGNOSIS — Z6831 Body mass index (BMI) 31.0-31.9, adult: Secondary | ICD-10-CM | POA: Diagnosis not present

## 2020-03-17 DIAGNOSIS — N959 Unspecified menopausal and perimenopausal disorder: Secondary | ICD-10-CM | POA: Diagnosis not present

## 2020-03-17 DIAGNOSIS — Z1231 Encounter for screening mammogram for malignant neoplasm of breast: Secondary | ICD-10-CM | POA: Diagnosis not present

## 2020-03-17 DIAGNOSIS — Z01419 Encounter for gynecological examination (general) (routine) without abnormal findings: Secondary | ICD-10-CM | POA: Diagnosis not present

## 2020-03-17 MED FILL — GABAPENTIN 100 MG CAPSULE: 100 | 30 days supply | Qty: 30 | Fill #0

## 2020-03-18 ENCOUNTER — Other Ambulatory Visit (HOSPITAL_COMMUNITY): Payer: Self-pay | Admitting: Family Medicine

## 2020-03-18 MED FILL — DULOXETINE HCL 60 MG CPEP: 60 | 90 days supply | Qty: 90 | Fill #2

## 2020-03-18 MED FILL — ATORVASTATIN CALCIUM 40 MG: 40 | 90 days supply | Qty: 90 | Fill #0

## 2020-03-18 MED FILL — GLIPIZIDE ER 2.5 MG TB24: 2.5 | 90 days supply | Qty: 90 | Fill #0

## 2020-04-06 DIAGNOSIS — E559 Vitamin D deficiency, unspecified: Secondary | ICD-10-CM | POA: Diagnosis not present

## 2020-04-06 DIAGNOSIS — F5101 Primary insomnia: Secondary | ICD-10-CM | POA: Diagnosis not present

## 2020-04-06 DIAGNOSIS — E1169 Type 2 diabetes mellitus with other specified complication: Secondary | ICD-10-CM | POA: Diagnosis not present

## 2020-04-06 DIAGNOSIS — R5383 Other fatigue: Secondary | ICD-10-CM | POA: Diagnosis not present

## 2020-06-08 DIAGNOSIS — H40013 Open angle with borderline findings, low risk, bilateral: Secondary | ICD-10-CM | POA: Diagnosis not present

## 2020-06-17 MED FILL — DULOXETINE HCL 60 MG CPEP: 60 | 90 days supply | Qty: 90 | Fill #3

## 2020-06-18 ENCOUNTER — Other Ambulatory Visit (HOSPITAL_COMMUNITY): Payer: Self-pay | Admitting: Family Medicine

## 2020-06-18 MED FILL — ATORVASTATIN 40 MG TABLET: 40 | 90 days supply | Qty: 90 | Fill #0

## 2020-07-14 MED FILL — GLIPIZIDE ER 2.5 MG TB24: 2.5 | 90 days supply | Qty: 90 | Fill #1

## 2020-08-31 DIAGNOSIS — L918 Other hypertrophic disorders of the skin: Secondary | ICD-10-CM | POA: Diagnosis not present

## 2020-08-31 DIAGNOSIS — L821 Other seborrheic keratosis: Secondary | ICD-10-CM | POA: Diagnosis not present

## 2020-08-31 DIAGNOSIS — Z85828 Personal history of other malignant neoplasm of skin: Secondary | ICD-10-CM | POA: Diagnosis not present

## 2020-08-31 DIAGNOSIS — D1801 Hemangioma of skin and subcutaneous tissue: Secondary | ICD-10-CM | POA: Diagnosis not present

## 2020-08-31 DIAGNOSIS — D2239 Melanocytic nevi of other parts of face: Secondary | ICD-10-CM | POA: Diagnosis not present

## 2020-08-31 DIAGNOSIS — L820 Inflamed seborrheic keratosis: Secondary | ICD-10-CM | POA: Diagnosis not present

## 2020-08-31 DIAGNOSIS — D2262 Melanocytic nevi of left upper limb, including shoulder: Secondary | ICD-10-CM | POA: Diagnosis not present

## 2020-08-31 DIAGNOSIS — D225 Melanocytic nevi of trunk: Secondary | ICD-10-CM | POA: Diagnosis not present

## 2020-08-31 DIAGNOSIS — L905 Scar conditions and fibrosis of skin: Secondary | ICD-10-CM | POA: Diagnosis not present

## 2020-09-24 ENCOUNTER — Other Ambulatory Visit (HOSPITAL_COMMUNITY): Payer: Self-pay | Admitting: Family Medicine

## 2020-09-24 MED FILL — ATORVASTATIN 40 MG TABLET: 40 | 90 days supply | Qty: 90 | Fill #1

## 2020-09-24 MED FILL — DULOXETINE HCL 60 MG CPEP: 60 | 30 days supply | Qty: 30 | Fill #0

## 2020-10-20 DIAGNOSIS — Z7984 Long term (current) use of oral hypoglycemic drugs: Secondary | ICD-10-CM | POA: Diagnosis not present

## 2020-10-20 DIAGNOSIS — K219 Gastro-esophageal reflux disease without esophagitis: Secondary | ICD-10-CM | POA: Diagnosis not present

## 2020-10-20 DIAGNOSIS — E1169 Type 2 diabetes mellitus with other specified complication: Secondary | ICD-10-CM | POA: Diagnosis not present

## 2020-10-20 DIAGNOSIS — K5900 Constipation, unspecified: Secondary | ICD-10-CM | POA: Diagnosis not present

## 2020-10-20 DIAGNOSIS — E782 Mixed hyperlipidemia: Secondary | ICD-10-CM | POA: Diagnosis not present

## 2020-10-20 DIAGNOSIS — J4540 Moderate persistent asthma, uncomplicated: Secondary | ICD-10-CM | POA: Diagnosis not present

## 2020-10-25 ENCOUNTER — Other Ambulatory Visit (HOSPITAL_COMMUNITY): Payer: Self-pay | Admitting: Family Medicine

## 2020-10-25 MED FILL — DULOXETINE HCL 60 MG CPEP: 60 | 30 days supply | Qty: 30 | Fill #0

## 2020-11-24 ENCOUNTER — Other Ambulatory Visit (HOSPITAL_COMMUNITY): Payer: Self-pay

## 2020-11-24 MED ORDER — ATORVASTATIN CALCIUM 40 MG PO TABS
ORAL_TABLET | ORAL | 1 refills | Status: DC
  Filled 2020-11-24: qty 90, 90d supply, fill #0
  Filled 2021-03-23: qty 90, 90d supply, fill #1

## 2020-11-24 MED ORDER — GLIPIZIDE ER 2.5 MG PO TB24
ORAL_TABLET | ORAL | 1 refills | Status: DC
  Filled 2020-11-24: qty 90, 90d supply, fill #0
  Filled 2021-03-23: qty 90, 90d supply, fill #1

## 2020-11-24 MED ORDER — DULOXETINE HCL 60 MG PO CPEP
ORAL_CAPSULE | ORAL | 1 refills | Status: DC
  Filled 2020-11-24: qty 90, 90d supply, fill #0
  Filled 2021-02-24: qty 90, 90d supply, fill #1

## 2020-11-25 ENCOUNTER — Other Ambulatory Visit (HOSPITAL_COMMUNITY): Payer: Self-pay

## 2020-12-02 ENCOUNTER — Other Ambulatory Visit (HOSPITAL_COMMUNITY): Payer: Self-pay

## 2020-12-06 ENCOUNTER — Other Ambulatory Visit (HOSPITAL_COMMUNITY): Payer: Self-pay

## 2020-12-06 DIAGNOSIS — K5901 Slow transit constipation: Secondary | ICD-10-CM | POA: Diagnosis not present

## 2020-12-08 ENCOUNTER — Other Ambulatory Visit (HOSPITAL_COMMUNITY): Payer: Self-pay

## 2020-12-22 DIAGNOSIS — K5901 Slow transit constipation: Secondary | ICD-10-CM | POA: Diagnosis not present

## 2020-12-22 DIAGNOSIS — R0600 Dyspnea, unspecified: Secondary | ICD-10-CM | POA: Diagnosis not present

## 2020-12-22 DIAGNOSIS — Z6831 Body mass index (BMI) 31.0-31.9, adult: Secondary | ICD-10-CM | POA: Diagnosis not present

## 2021-01-20 ENCOUNTER — Other Ambulatory Visit (HOSPITAL_COMMUNITY): Payer: Self-pay

## 2021-01-24 DIAGNOSIS — M25512 Pain in left shoulder: Secondary | ICD-10-CM | POA: Diagnosis not present

## 2021-02-09 DIAGNOSIS — M25512 Pain in left shoulder: Secondary | ICD-10-CM | POA: Diagnosis not present

## 2021-02-24 ENCOUNTER — Other Ambulatory Visit (HOSPITAL_COMMUNITY): Payer: Self-pay

## 2021-02-24 DIAGNOSIS — M25512 Pain in left shoulder: Secondary | ICD-10-CM | POA: Diagnosis not present

## 2021-03-02 DIAGNOSIS — K219 Gastro-esophageal reflux disease without esophagitis: Secondary | ICD-10-CM | POA: Diagnosis not present

## 2021-03-02 DIAGNOSIS — Z86010 Personal history of colonic polyps: Secondary | ICD-10-CM | POA: Diagnosis not present

## 2021-03-02 DIAGNOSIS — K5909 Other constipation: Secondary | ICD-10-CM | POA: Diagnosis not present

## 2021-03-23 ENCOUNTER — Other Ambulatory Visit (HOSPITAL_COMMUNITY): Payer: Self-pay

## 2021-03-23 DIAGNOSIS — M25512 Pain in left shoulder: Secondary | ICD-10-CM | POA: Diagnosis not present

## 2021-04-20 ENCOUNTER — Other Ambulatory Visit (HOSPITAL_COMMUNITY): Payer: Self-pay

## 2021-04-20 DIAGNOSIS — E782 Mixed hyperlipidemia: Secondary | ICD-10-CM | POA: Diagnosis not present

## 2021-04-20 DIAGNOSIS — K219 Gastro-esophageal reflux disease without esophagitis: Secondary | ICD-10-CM | POA: Diagnosis not present

## 2021-04-20 DIAGNOSIS — J4540 Moderate persistent asthma, uncomplicated: Secondary | ICD-10-CM | POA: Diagnosis not present

## 2021-04-20 DIAGNOSIS — E1169 Type 2 diabetes mellitus with other specified complication: Secondary | ICD-10-CM | POA: Diagnosis not present

## 2021-04-20 DIAGNOSIS — F330 Major depressive disorder, recurrent, mild: Secondary | ICD-10-CM | POA: Diagnosis not present

## 2021-04-20 DIAGNOSIS — N951 Menopausal and female climacteric states: Secondary | ICD-10-CM | POA: Diagnosis not present

## 2021-04-20 MED ORDER — ESTRADIOL 0.5 MG PO TABS
0.5000 mg | ORAL_TABLET | Freq: Every day | ORAL | 0 refills | Status: DC
  Filled 2021-04-20: qty 90, 90d supply, fill #0

## 2021-05-25 ENCOUNTER — Other Ambulatory Visit (HOSPITAL_COMMUNITY): Payer: Self-pay

## 2021-05-25 MED ORDER — DULOXETINE HCL 60 MG PO CPEP
60.0000 mg | ORAL_CAPSULE | Freq: Every day | ORAL | 1 refills | Status: DC
  Filled 2021-05-25: qty 90, 90d supply, fill #0
  Filled 2021-08-23: qty 90, 90d supply, fill #1

## 2021-05-31 ENCOUNTER — Other Ambulatory Visit (HOSPITAL_COMMUNITY): Payer: Self-pay

## 2021-05-31 MED ORDER — PEG 3350-KCL-NA BICARB-NACL 420 G PO SOLR
ORAL | 0 refills | Status: DC
  Filled 2021-05-31: qty 4000, 1d supply, fill #0

## 2021-06-02 DIAGNOSIS — R12 Heartburn: Secondary | ICD-10-CM | POA: Diagnosis not present

## 2021-06-02 DIAGNOSIS — K648 Other hemorrhoids: Secondary | ICD-10-CM | POA: Diagnosis not present

## 2021-06-02 DIAGNOSIS — K317 Polyp of stomach and duodenum: Secondary | ICD-10-CM | POA: Diagnosis not present

## 2021-06-02 DIAGNOSIS — K2930 Chronic superficial gastritis without bleeding: Secondary | ICD-10-CM | POA: Diagnosis not present

## 2021-06-02 DIAGNOSIS — Z86010 Personal history of colonic polyps: Secondary | ICD-10-CM | POA: Diagnosis not present

## 2021-06-02 DIAGNOSIS — B9681 Helicobacter pylori [H. pylori] as the cause of diseases classified elsewhere: Secondary | ICD-10-CM | POA: Diagnosis not present

## 2021-06-02 DIAGNOSIS — K644 Residual hemorrhoidal skin tags: Secondary | ICD-10-CM | POA: Diagnosis not present

## 2021-06-02 DIAGNOSIS — K621 Rectal polyp: Secondary | ICD-10-CM | POA: Diagnosis not present

## 2021-06-28 ENCOUNTER — Other Ambulatory Visit (HOSPITAL_COMMUNITY): Payer: Self-pay

## 2021-06-28 MED ORDER — ATORVASTATIN CALCIUM 40 MG PO TABS
ORAL_TABLET | ORAL | 1 refills | Status: DC
  Filled 2021-06-28: qty 90, 90d supply, fill #0
  Filled 2021-09-27: qty 90, 90d supply, fill #1

## 2021-06-28 MED ORDER — GLIPIZIDE ER 2.5 MG PO TB24
ORAL_TABLET | ORAL | 1 refills | Status: DC
Start: 2021-06-28 — End: 2023-11-07
  Filled 2021-06-28: qty 90, 90d supply, fill #0

## 2021-06-29 ENCOUNTER — Other Ambulatory Visit (HOSPITAL_COMMUNITY): Payer: Self-pay

## 2021-07-04 ENCOUNTER — Other Ambulatory Visit (HOSPITAL_COMMUNITY): Payer: Self-pay

## 2021-07-04 MED ORDER — ALBUTEROL SULFATE HFA 108 (90 BASE) MCG/ACT IN AERS
INHALATION_SPRAY | RESPIRATORY_TRACT | 0 refills | Status: AC
  Filled 2021-07-04: qty 54, 75d supply, fill #0

## 2021-07-05 DIAGNOSIS — H40013 Open angle with borderline findings, low risk, bilateral: Secondary | ICD-10-CM | POA: Diagnosis not present

## 2021-07-07 DIAGNOSIS — Z1231 Encounter for screening mammogram for malignant neoplasm of breast: Secondary | ICD-10-CM | POA: Diagnosis not present

## 2021-07-07 DIAGNOSIS — Z01419 Encounter for gynecological examination (general) (routine) without abnormal findings: Secondary | ICD-10-CM | POA: Diagnosis not present

## 2021-07-07 DIAGNOSIS — Z6831 Body mass index (BMI) 31.0-31.9, adult: Secondary | ICD-10-CM | POA: Diagnosis not present

## 2021-07-13 ENCOUNTER — Other Ambulatory Visit (HOSPITAL_COMMUNITY): Payer: Self-pay

## 2021-07-13 MED ORDER — ESTRADIOL 0.5 MG PO TABS
ORAL_TABLET | ORAL | 0 refills | Status: DC
  Filled 2021-07-13: qty 90, 90d supply, fill #0

## 2021-07-19 DIAGNOSIS — Z23 Encounter for immunization: Secondary | ICD-10-CM | POA: Diagnosis not present

## 2021-07-19 DIAGNOSIS — N951 Menopausal and female climacteric states: Secondary | ICD-10-CM | POA: Diagnosis not present

## 2021-07-25 ENCOUNTER — Other Ambulatory Visit (HOSPITAL_COMMUNITY): Payer: Self-pay

## 2021-07-25 MED ORDER — FLUTICASONE-SALMETEROL 100-50 MCG/ACT IN AEPB
INHALATION_SPRAY | RESPIRATORY_TRACT | 3 refills | Status: DC
  Filled 2021-07-25: qty 60, 30d supply, fill #0

## 2021-08-22 ENCOUNTER — Other Ambulatory Visit (HOSPITAL_COMMUNITY): Payer: Self-pay

## 2021-08-23 ENCOUNTER — Other Ambulatory Visit (HOSPITAL_COMMUNITY): Payer: Self-pay

## 2021-08-31 DIAGNOSIS — L918 Other hypertrophic disorders of the skin: Secondary | ICD-10-CM | POA: Diagnosis not present

## 2021-08-31 DIAGNOSIS — L821 Other seborrheic keratosis: Secondary | ICD-10-CM | POA: Diagnosis not present

## 2021-08-31 DIAGNOSIS — L298 Other pruritus: Secondary | ICD-10-CM | POA: Diagnosis not present

## 2021-08-31 DIAGNOSIS — Z85828 Personal history of other malignant neoplasm of skin: Secondary | ICD-10-CM | POA: Diagnosis not present

## 2021-08-31 DIAGNOSIS — D1801 Hemangioma of skin and subcutaneous tissue: Secondary | ICD-10-CM | POA: Diagnosis not present

## 2021-08-31 DIAGNOSIS — D2271 Melanocytic nevi of right lower limb, including hip: Secondary | ICD-10-CM | POA: Diagnosis not present

## 2021-08-31 DIAGNOSIS — D220 Melanocytic nevi of lip: Secondary | ICD-10-CM | POA: Diagnosis not present

## 2021-08-31 DIAGNOSIS — D225 Melanocytic nevi of trunk: Secondary | ICD-10-CM | POA: Diagnosis not present

## 2021-08-31 DIAGNOSIS — D2239 Melanocytic nevi of other parts of face: Secondary | ICD-10-CM | POA: Diagnosis not present

## 2021-09-27 ENCOUNTER — Other Ambulatory Visit (HOSPITAL_COMMUNITY): Payer: Self-pay

## 2021-09-27 MED ORDER — ESTRADIOL 0.5 MG PO TABS
0.5000 mg | ORAL_TABLET | Freq: Every day | ORAL | 0 refills | Status: DC
  Filled 2021-09-27 – 2021-10-04 (×2): qty 90, 90d supply, fill #0

## 2021-10-04 ENCOUNTER — Other Ambulatory Visit (HOSPITAL_COMMUNITY): Payer: Self-pay

## 2021-10-18 DIAGNOSIS — E782 Mixed hyperlipidemia: Secondary | ICD-10-CM | POA: Diagnosis not present

## 2021-10-18 DIAGNOSIS — E1169 Type 2 diabetes mellitus with other specified complication: Secondary | ICD-10-CM | POA: Diagnosis not present

## 2021-10-18 DIAGNOSIS — J4540 Moderate persistent asthma, uncomplicated: Secondary | ICD-10-CM | POA: Diagnosis not present

## 2021-10-18 DIAGNOSIS — F330 Major depressive disorder, recurrent, mild: Secondary | ICD-10-CM | POA: Diagnosis not present

## 2021-10-18 DIAGNOSIS — E669 Obesity, unspecified: Secondary | ICD-10-CM | POA: Diagnosis not present

## 2021-10-18 DIAGNOSIS — N951 Menopausal and female climacteric states: Secondary | ICD-10-CM | POA: Diagnosis not present

## 2021-11-23 ENCOUNTER — Other Ambulatory Visit (HOSPITAL_COMMUNITY): Payer: Self-pay

## 2021-11-23 MED ORDER — DULOXETINE HCL 60 MG PO CPEP
60.0000 mg | ORAL_CAPSULE | Freq: Every day | ORAL | 3 refills | Status: DC
  Filled 2021-11-23: qty 90, 90d supply, fill #0
  Filled 2022-02-25: qty 90, 90d supply, fill #1
  Filled 2022-05-25: qty 90, 90d supply, fill #2
  Filled 2022-08-24: qty 90, 90d supply, fill #3

## 2021-12-14 ENCOUNTER — Other Ambulatory Visit (HOSPITAL_COMMUNITY): Payer: Self-pay

## 2021-12-14 MED ORDER — ESTRADIOL 0.5 MG PO TABS
0.5000 mg | ORAL_TABLET | Freq: Every day | ORAL | 2 refills | Status: DC
  Filled 2021-12-14: qty 90, 90d supply, fill #0
  Filled 2022-03-24: qty 90, 90d supply, fill #1
  Filled 2022-06-28: qty 90, 90d supply, fill #2

## 2021-12-20 ENCOUNTER — Other Ambulatory Visit (HOSPITAL_COMMUNITY): Payer: Self-pay

## 2021-12-20 DIAGNOSIS — R509 Fever, unspecified: Secondary | ICD-10-CM | POA: Diagnosis not present

## 2021-12-20 DIAGNOSIS — J029 Acute pharyngitis, unspecified: Secondary | ICD-10-CM | POA: Diagnosis not present

## 2021-12-20 DIAGNOSIS — R0981 Nasal congestion: Secondary | ICD-10-CM | POA: Diagnosis not present

## 2021-12-20 DIAGNOSIS — R5383 Other fatigue: Secondary | ICD-10-CM | POA: Diagnosis not present

## 2021-12-20 DIAGNOSIS — U071 COVID-19: Secondary | ICD-10-CM | POA: Diagnosis not present

## 2021-12-20 DIAGNOSIS — R059 Cough, unspecified: Secondary | ICD-10-CM | POA: Diagnosis not present

## 2021-12-20 DIAGNOSIS — R051 Acute cough: Secondary | ICD-10-CM | POA: Diagnosis not present

## 2021-12-20 MED ORDER — BENZONATATE 100 MG PO CAPS
ORAL_CAPSULE | ORAL | 0 refills | Status: DC
  Filled 2021-12-20: qty 30, 7d supply, fill #0

## 2021-12-21 ENCOUNTER — Other Ambulatory Visit (HOSPITAL_COMMUNITY): Payer: Self-pay

## 2021-12-21 MED ORDER — PAXLOVID (300/100) 20 X 150 MG & 10 X 100MG PO TBPK
ORAL_TABLET | ORAL | 0 refills | Status: DC
  Filled 2021-12-21 (×2): qty 30, 5d supply, fill #0

## 2021-12-24 ENCOUNTER — Other Ambulatory Visit (HOSPITAL_COMMUNITY): Payer: Self-pay

## 2022-01-06 ENCOUNTER — Other Ambulatory Visit (HOSPITAL_COMMUNITY): Payer: Self-pay

## 2022-01-06 MED ORDER — ATORVASTATIN CALCIUM 40 MG PO TABS
40.0000 mg | ORAL_TABLET | Freq: Every day | ORAL | 1 refills | Status: DC
  Filled 2022-01-06: qty 90, 90d supply, fill #0
  Filled 2022-05-04: qty 90, 90d supply, fill #1

## 2022-02-02 DIAGNOSIS — Z13820 Encounter for screening for osteoporosis: Secondary | ICD-10-CM | POA: Diagnosis not present

## 2022-02-25 ENCOUNTER — Other Ambulatory Visit (HOSPITAL_COMMUNITY): Payer: Self-pay

## 2022-02-28 ENCOUNTER — Other Ambulatory Visit (HOSPITAL_COMMUNITY): Payer: Self-pay

## 2022-03-13 ENCOUNTER — Other Ambulatory Visit (HOSPITAL_COMMUNITY): Payer: Self-pay

## 2022-03-13 DIAGNOSIS — B9689 Other specified bacterial agents as the cause of diseases classified elsewhere: Secondary | ICD-10-CM | POA: Diagnosis not present

## 2022-03-13 DIAGNOSIS — N760 Acute vaginitis: Secondary | ICD-10-CM | POA: Diagnosis not present

## 2022-03-13 MED ORDER — METRONIDAZOLE 0.75 % VA GEL
VAGINAL | 0 refills | Status: DC
  Filled 2022-03-13: qty 70, 5d supply, fill #0

## 2022-03-24 ENCOUNTER — Other Ambulatory Visit (HOSPITAL_COMMUNITY): Payer: Self-pay

## 2022-04-25 DIAGNOSIS — J4520 Mild intermittent asthma, uncomplicated: Secondary | ICD-10-CM | POA: Diagnosis not present

## 2022-04-25 DIAGNOSIS — E1169 Type 2 diabetes mellitus with other specified complication: Secondary | ICD-10-CM | POA: Diagnosis not present

## 2022-04-25 DIAGNOSIS — K219 Gastro-esophageal reflux disease without esophagitis: Secondary | ICD-10-CM | POA: Diagnosis not present

## 2022-04-25 DIAGNOSIS — Z23 Encounter for immunization: Secondary | ICD-10-CM | POA: Diagnosis not present

## 2022-04-25 DIAGNOSIS — E782 Mixed hyperlipidemia: Secondary | ICD-10-CM | POA: Diagnosis not present

## 2022-04-25 DIAGNOSIS — F330 Major depressive disorder, recurrent, mild: Secondary | ICD-10-CM | POA: Diagnosis not present

## 2022-05-04 ENCOUNTER — Other Ambulatory Visit (HOSPITAL_COMMUNITY): Payer: Self-pay

## 2022-05-15 ENCOUNTER — Other Ambulatory Visit (HOSPITAL_COMMUNITY): Payer: Self-pay

## 2022-05-16 ENCOUNTER — Other Ambulatory Visit (HOSPITAL_COMMUNITY): Payer: Self-pay

## 2022-05-16 MED ORDER — BUDESONIDE-FORMOTEROL FUMARATE 80-4.5 MCG/ACT IN AERO
INHALATION_SPRAY | RESPIRATORY_TRACT | 2 refills | Status: DC
  Filled 2022-05-16: qty 10.2, 30d supply, fill #0

## 2022-05-17 ENCOUNTER — Other Ambulatory Visit (HOSPITAL_COMMUNITY): Payer: Self-pay

## 2022-05-18 ENCOUNTER — Other Ambulatory Visit: Payer: Self-pay

## 2022-05-18 ENCOUNTER — Other Ambulatory Visit (HOSPITAL_COMMUNITY): Payer: Self-pay

## 2022-05-18 MED ORDER — BUDESONIDE-FORMOTEROL FUMARATE 80-4.5 MCG/ACT IN AERO
2.0000 | INHALATION_SPRAY | Freq: Four times a day (QID) | RESPIRATORY_TRACT | 2 refills | Status: DC | PRN
  Filled 2022-05-18: qty 10.2, 15d supply, fill #0

## 2022-05-25 ENCOUNTER — Other Ambulatory Visit (HOSPITAL_COMMUNITY): Payer: Self-pay

## 2022-06-28 ENCOUNTER — Other Ambulatory Visit (HOSPITAL_COMMUNITY): Payer: Self-pay

## 2022-07-04 ENCOUNTER — Other Ambulatory Visit (HOSPITAL_COMMUNITY): Payer: Self-pay

## 2022-08-02 ENCOUNTER — Other Ambulatory Visit: Payer: Self-pay

## 2022-08-02 ENCOUNTER — Other Ambulatory Visit (HOSPITAL_COMMUNITY): Payer: Self-pay

## 2022-08-02 MED ORDER — ATORVASTATIN CALCIUM 40 MG PO TABS
40.0000 mg | ORAL_TABLET | Freq: Every day | ORAL | 1 refills | Status: DC
  Filled 2022-08-02: qty 90, 90d supply, fill #0
  Filled 2022-11-02: qty 90, 90d supply, fill #1

## 2022-08-16 ENCOUNTER — Other Ambulatory Visit (HOSPITAL_COMMUNITY): Payer: Self-pay

## 2022-08-23 DIAGNOSIS — M25512 Pain in left shoulder: Secondary | ICD-10-CM | POA: Diagnosis not present

## 2022-08-23 DIAGNOSIS — M25511 Pain in right shoulder: Secondary | ICD-10-CM | POA: Diagnosis not present

## 2022-08-24 ENCOUNTER — Other Ambulatory Visit (HOSPITAL_COMMUNITY): Payer: Self-pay

## 2022-09-05 DIAGNOSIS — M25512 Pain in left shoulder: Secondary | ICD-10-CM | POA: Diagnosis not present

## 2022-09-05 DIAGNOSIS — M25511 Pain in right shoulder: Secondary | ICD-10-CM | POA: Diagnosis not present

## 2022-09-13 DIAGNOSIS — Z124 Encounter for screening for malignant neoplasm of cervix: Secondary | ICD-10-CM | POA: Diagnosis not present

## 2022-09-13 DIAGNOSIS — Z1231 Encounter for screening mammogram for malignant neoplasm of breast: Secondary | ICD-10-CM | POA: Diagnosis not present

## 2022-09-13 DIAGNOSIS — M19011 Primary osteoarthritis, right shoulder: Secondary | ICD-10-CM | POA: Diagnosis not present

## 2022-09-13 DIAGNOSIS — Z1272 Encounter for screening for malignant neoplasm of vagina: Secondary | ICD-10-CM | POA: Diagnosis not present

## 2022-09-13 DIAGNOSIS — Z6830 Body mass index (BMI) 30.0-30.9, adult: Secondary | ICD-10-CM | POA: Diagnosis not present

## 2022-09-13 DIAGNOSIS — S43431A Superior glenoid labrum lesion of right shoulder, initial encounter: Secondary | ICD-10-CM | POA: Diagnosis not present

## 2022-09-13 DIAGNOSIS — N952 Postmenopausal atrophic vaginitis: Secondary | ICD-10-CM | POA: Diagnosis not present

## 2022-09-13 DIAGNOSIS — M75121 Complete rotator cuff tear or rupture of right shoulder, not specified as traumatic: Secondary | ICD-10-CM | POA: Diagnosis not present

## 2022-09-28 ENCOUNTER — Other Ambulatory Visit (HOSPITAL_COMMUNITY): Payer: Self-pay

## 2022-09-28 DIAGNOSIS — D2271 Melanocytic nevi of right lower limb, including hip: Secondary | ICD-10-CM | POA: Diagnosis not present

## 2022-09-28 DIAGNOSIS — D1801 Hemangioma of skin and subcutaneous tissue: Secondary | ICD-10-CM | POA: Diagnosis not present

## 2022-09-28 DIAGNOSIS — D2262 Melanocytic nevi of left upper limb, including shoulder: Secondary | ICD-10-CM | POA: Diagnosis not present

## 2022-09-28 DIAGNOSIS — L814 Other melanin hyperpigmentation: Secondary | ICD-10-CM | POA: Diagnosis not present

## 2022-09-28 DIAGNOSIS — D225 Melanocytic nevi of trunk: Secondary | ICD-10-CM | POA: Diagnosis not present

## 2022-09-28 DIAGNOSIS — H40013 Open angle with borderline findings, low risk, bilateral: Secondary | ICD-10-CM | POA: Diagnosis not present

## 2022-09-28 DIAGNOSIS — L821 Other seborrheic keratosis: Secondary | ICD-10-CM | POA: Diagnosis not present

## 2022-09-28 DIAGNOSIS — L918 Other hypertrophic disorders of the skin: Secondary | ICD-10-CM | POA: Diagnosis not present

## 2022-09-28 DIAGNOSIS — Z85828 Personal history of other malignant neoplasm of skin: Secondary | ICD-10-CM | POA: Diagnosis not present

## 2022-09-29 ENCOUNTER — Other Ambulatory Visit (HOSPITAL_COMMUNITY): Payer: Self-pay

## 2022-09-29 MED ORDER — ESTRADIOL 0.5 MG PO TABS
0.5000 mg | ORAL_TABLET | Freq: Every day | ORAL | 2 refills | Status: DC
  Filled 2022-09-29: qty 90, 90d supply, fill #0
  Filled 2022-12-26: qty 90, 90d supply, fill #1
  Filled 2023-03-26: qty 90, 90d supply, fill #2

## 2022-10-24 ENCOUNTER — Other Ambulatory Visit (HOSPITAL_COMMUNITY): Payer: Self-pay

## 2022-10-24 DIAGNOSIS — J4540 Moderate persistent asthma, uncomplicated: Secondary | ICD-10-CM | POA: Diagnosis not present

## 2022-10-24 DIAGNOSIS — F330 Major depressive disorder, recurrent, mild: Secondary | ICD-10-CM | POA: Diagnosis not present

## 2022-10-24 DIAGNOSIS — K219 Gastro-esophageal reflux disease without esophagitis: Secondary | ICD-10-CM | POA: Diagnosis not present

## 2022-10-24 DIAGNOSIS — E669 Obesity, unspecified: Secondary | ICD-10-CM | POA: Diagnosis not present

## 2022-10-24 DIAGNOSIS — Z6830 Body mass index (BMI) 30.0-30.9, adult: Secondary | ICD-10-CM | POA: Diagnosis not present

## 2022-10-24 DIAGNOSIS — Z79899 Other long term (current) drug therapy: Secondary | ICD-10-CM | POA: Diagnosis not present

## 2022-10-24 DIAGNOSIS — E782 Mixed hyperlipidemia: Secondary | ICD-10-CM | POA: Diagnosis not present

## 2022-10-24 DIAGNOSIS — R7303 Prediabetes: Secondary | ICD-10-CM | POA: Diagnosis not present

## 2022-10-24 DIAGNOSIS — R030 Elevated blood-pressure reading, without diagnosis of hypertension: Secondary | ICD-10-CM | POA: Diagnosis not present

## 2022-10-24 MED ORDER — BUDESONIDE-FORMOTEROL FUMARATE 80-4.5 MCG/ACT IN AERO
2.0000 | INHALATION_SPRAY | Freq: Four times a day (QID) | RESPIRATORY_TRACT | 2 refills | Status: DC | PRN
  Filled 2022-10-24 – 2022-11-02 (×3): qty 10.2, 15d supply, fill #0
  Filled 2023-02-20: qty 10.2, 15d supply, fill #1
  Filled 2023-05-21 – 2023-05-29 (×2): qty 10.2, 15d supply, fill #2

## 2022-10-24 MED ORDER — AMOXICILLIN-POT CLAVULANATE 875-125 MG PO TABS
1.0000 | ORAL_TABLET | Freq: Two times a day (BID) | ORAL | 0 refills | Status: DC
  Filled 2022-10-24 – 2022-10-25 (×2): qty 14, 7d supply, fill #0

## 2022-10-25 ENCOUNTER — Other Ambulatory Visit (HOSPITAL_COMMUNITY): Payer: Self-pay

## 2022-10-25 ENCOUNTER — Other Ambulatory Visit: Payer: Self-pay

## 2022-11-02 ENCOUNTER — Other Ambulatory Visit: Payer: Self-pay

## 2022-11-02 ENCOUNTER — Other Ambulatory Visit (HOSPITAL_COMMUNITY): Payer: Self-pay

## 2022-11-07 ENCOUNTER — Other Ambulatory Visit (HOSPITAL_COMMUNITY): Payer: Self-pay

## 2022-11-07 ENCOUNTER — Other Ambulatory Visit: Payer: Self-pay

## 2022-11-07 MED ORDER — LOSARTAN POTASSIUM 25 MG PO TABS
25.0000 mg | ORAL_TABLET | Freq: Every day | ORAL | 0 refills | Status: DC
  Filled 2022-11-07: qty 30, 30d supply, fill #0

## 2022-11-22 DIAGNOSIS — Z6830 Body mass index (BMI) 30.0-30.9, adult: Secondary | ICD-10-CM | POA: Diagnosis not present

## 2022-11-22 DIAGNOSIS — I10 Essential (primary) hypertension: Secondary | ICD-10-CM | POA: Diagnosis not present

## 2022-11-26 ENCOUNTER — Other Ambulatory Visit (HOSPITAL_COMMUNITY): Payer: Self-pay

## 2022-11-27 ENCOUNTER — Other Ambulatory Visit: Payer: Self-pay

## 2022-11-27 ENCOUNTER — Other Ambulatory Visit (HOSPITAL_COMMUNITY): Payer: Self-pay

## 2022-11-27 MED ORDER — DULOXETINE HCL 60 MG PO CPEP
60.0000 mg | ORAL_CAPSULE | Freq: Every day | ORAL | 3 refills | Status: DC
  Filled 2022-11-27: qty 90, 90d supply, fill #0
  Filled 2023-02-20: qty 90, 90d supply, fill #1
  Filled 2023-05-21: qty 90, 90d supply, fill #2
  Filled 2023-08-19: qty 90, 90d supply, fill #3

## 2022-11-27 MED ORDER — LOSARTAN POTASSIUM 25 MG PO TABS
25.0000 mg | ORAL_TABLET | Freq: Every day | ORAL | 1 refills | Status: DC
  Filled 2022-11-27 – 2022-12-04 (×2): qty 90, 90d supply, fill #0
  Filled 2023-03-04: qty 90, 90d supply, fill #1

## 2022-11-28 ENCOUNTER — Other Ambulatory Visit (HOSPITAL_COMMUNITY): Payer: Self-pay

## 2022-12-04 ENCOUNTER — Other Ambulatory Visit (HOSPITAL_COMMUNITY): Payer: Self-pay

## 2022-12-13 DIAGNOSIS — M7542 Impingement syndrome of left shoulder: Secondary | ICD-10-CM | POA: Diagnosis not present

## 2022-12-15 ENCOUNTER — Other Ambulatory Visit: Payer: Self-pay

## 2022-12-15 ENCOUNTER — Other Ambulatory Visit (HOSPITAL_COMMUNITY): Payer: Self-pay

## 2022-12-15 DIAGNOSIS — M7522 Bicipital tendinitis, left shoulder: Secondary | ICD-10-CM | POA: Diagnosis not present

## 2022-12-15 DIAGNOSIS — G8918 Other acute postprocedural pain: Secondary | ICD-10-CM | POA: Diagnosis not present

## 2022-12-15 DIAGNOSIS — M94212 Chondromalacia, left shoulder: Secondary | ICD-10-CM | POA: Diagnosis not present

## 2022-12-15 DIAGNOSIS — M75112 Incomplete rotator cuff tear or rupture of left shoulder, not specified as traumatic: Secondary | ICD-10-CM | POA: Diagnosis not present

## 2022-12-15 DIAGNOSIS — M24012 Loose body in left shoulder: Secondary | ICD-10-CM | POA: Diagnosis not present

## 2022-12-15 DIAGNOSIS — M7542 Impingement syndrome of left shoulder: Secondary | ICD-10-CM | POA: Diagnosis not present

## 2022-12-15 DIAGNOSIS — M19012 Primary osteoarthritis, left shoulder: Secondary | ICD-10-CM | POA: Diagnosis not present

## 2022-12-15 DIAGNOSIS — S43492A Other sprain of left shoulder joint, initial encounter: Secondary | ICD-10-CM | POA: Diagnosis not present

## 2022-12-15 MED ORDER — ONDANSETRON HCL 4 MG PO TABS
4.0000 mg | ORAL_TABLET | ORAL | 0 refills | Status: DC
  Filled 2022-12-15: qty 10, 3d supply, fill #0

## 2022-12-15 MED ORDER — NAPROXEN 500 MG PO TABS
500.0000 mg | ORAL_TABLET | Freq: Two times a day (BID) | ORAL | 1 refills | Status: DC
  Filled 2022-12-15: qty 60, 30d supply, fill #0
  Filled 2023-01-09: qty 60, 30d supply, fill #1

## 2022-12-15 MED ORDER — CYCLOBENZAPRINE HCL 10 MG PO TABS
10.0000 mg | ORAL_TABLET | ORAL | 0 refills | Status: DC
  Filled 2022-12-15: qty 30, 8d supply, fill #0

## 2022-12-15 MED ORDER — OXYCODONE-ACETAMINOPHEN 5-325 MG PO TABS
1.0000 | ORAL_TABLET | ORAL | 0 refills | Status: DC
  Filled 2022-12-15: qty 15, 3d supply, fill #0

## 2022-12-16 ENCOUNTER — Other Ambulatory Visit (HOSPITAL_COMMUNITY): Payer: Self-pay

## 2022-12-22 DIAGNOSIS — M25512 Pain in left shoulder: Secondary | ICD-10-CM | POA: Diagnosis not present

## 2022-12-26 ENCOUNTER — Other Ambulatory Visit (HOSPITAL_COMMUNITY): Payer: Self-pay

## 2023-01-09 ENCOUNTER — Other Ambulatory Visit (HOSPITAL_COMMUNITY): Payer: Self-pay

## 2023-01-10 DIAGNOSIS — M25512 Pain in left shoulder: Secondary | ICD-10-CM | POA: Diagnosis not present

## 2023-01-25 ENCOUNTER — Other Ambulatory Visit: Payer: Self-pay

## 2023-01-25 ENCOUNTER — Other Ambulatory Visit (HOSPITAL_COMMUNITY): Payer: Self-pay

## 2023-01-25 MED ORDER — ATORVASTATIN CALCIUM 40 MG PO TABS
40.0000 mg | ORAL_TABLET | Freq: Every day | ORAL | 1 refills | Status: DC
  Filled 2023-01-25: qty 90, 90d supply, fill #0
  Filled 2023-05-02: qty 90, 90d supply, fill #1

## 2023-02-20 ENCOUNTER — Other Ambulatory Visit: Payer: Self-pay

## 2023-02-20 DIAGNOSIS — Z6830 Body mass index (BMI) 30.0-30.9, adult: Secondary | ICD-10-CM | POA: Diagnosis not present

## 2023-02-20 DIAGNOSIS — I10 Essential (primary) hypertension: Secondary | ICD-10-CM | POA: Diagnosis not present

## 2023-03-01 ENCOUNTER — Encounter (HOSPITAL_COMMUNITY): Payer: Self-pay

## 2023-03-01 ENCOUNTER — Other Ambulatory Visit (HOSPITAL_COMMUNITY): Payer: Self-pay

## 2023-03-02 ENCOUNTER — Other Ambulatory Visit (HOSPITAL_COMMUNITY): Payer: Self-pay

## 2023-03-05 ENCOUNTER — Other Ambulatory Visit: Payer: Self-pay

## 2023-03-26 ENCOUNTER — Other Ambulatory Visit (HOSPITAL_COMMUNITY): Payer: Self-pay

## 2023-05-02 ENCOUNTER — Other Ambulatory Visit: Payer: Self-pay

## 2023-05-02 DIAGNOSIS — Z1159 Encounter for screening for other viral diseases: Secondary | ICD-10-CM | POA: Diagnosis not present

## 2023-05-02 DIAGNOSIS — Z23 Encounter for immunization: Secondary | ICD-10-CM | POA: Diagnosis not present

## 2023-05-02 DIAGNOSIS — E669 Obesity, unspecified: Secondary | ICD-10-CM | POA: Diagnosis not present

## 2023-05-02 DIAGNOSIS — R7303 Prediabetes: Secondary | ICD-10-CM | POA: Diagnosis not present

## 2023-05-02 DIAGNOSIS — Z0000 Encounter for general adult medical examination without abnormal findings: Secondary | ICD-10-CM | POA: Diagnosis not present

## 2023-05-02 DIAGNOSIS — N951 Menopausal and female climacteric states: Secondary | ICD-10-CM | POA: Diagnosis not present

## 2023-05-02 DIAGNOSIS — E782 Mixed hyperlipidemia: Secondary | ICD-10-CM | POA: Diagnosis not present

## 2023-05-02 DIAGNOSIS — Z6830 Body mass index (BMI) 30.0-30.9, adult: Secondary | ICD-10-CM | POA: Diagnosis not present

## 2023-05-02 DIAGNOSIS — I10 Essential (primary) hypertension: Secondary | ICD-10-CM | POA: Diagnosis not present

## 2023-05-02 DIAGNOSIS — F330 Major depressive disorder, recurrent, mild: Secondary | ICD-10-CM | POA: Diagnosis not present

## 2023-05-21 ENCOUNTER — Other Ambulatory Visit (HOSPITAL_COMMUNITY): Payer: Self-pay

## 2023-05-21 MED ORDER — LOSARTAN POTASSIUM 25 MG PO TABS
25.0000 mg | ORAL_TABLET | Freq: Every day | ORAL | 1 refills | Status: DC
  Filled 2023-05-21: qty 90, 90d supply, fill #0
  Filled 2023-08-29: qty 90, 90d supply, fill #1

## 2023-05-22 ENCOUNTER — Other Ambulatory Visit (HOSPITAL_COMMUNITY): Payer: Self-pay

## 2023-05-22 ENCOUNTER — Other Ambulatory Visit: Payer: Self-pay

## 2023-05-24 ENCOUNTER — Other Ambulatory Visit: Payer: Self-pay

## 2023-05-29 ENCOUNTER — Other Ambulatory Visit (HOSPITAL_COMMUNITY): Payer: Self-pay

## 2023-06-23 ENCOUNTER — Other Ambulatory Visit (HOSPITAL_COMMUNITY): Payer: Self-pay

## 2023-06-26 ENCOUNTER — Other Ambulatory Visit (HOSPITAL_COMMUNITY): Payer: Self-pay

## 2023-06-26 MED ORDER — ESTRADIOL 0.5 MG PO TABS
0.2500 mg | ORAL_TABLET | Freq: Every day | ORAL | 0 refills | Status: DC
  Filled 2023-06-26: qty 45, 90d supply, fill #0

## 2023-06-29 ENCOUNTER — Other Ambulatory Visit (HOSPITAL_COMMUNITY): Payer: Self-pay

## 2023-07-24 ENCOUNTER — Other Ambulatory Visit (HOSPITAL_COMMUNITY): Payer: Self-pay

## 2023-07-25 ENCOUNTER — Other Ambulatory Visit: Payer: Self-pay

## 2023-07-25 ENCOUNTER — Other Ambulatory Visit (HOSPITAL_COMMUNITY): Payer: Self-pay

## 2023-07-25 MED ORDER — ATORVASTATIN CALCIUM 40 MG PO TABS
40.0000 mg | ORAL_TABLET | Freq: Every day | ORAL | 1 refills | Status: DC
  Filled 2023-07-25: qty 90, 90d supply, fill #0
  Filled 2023-10-25: qty 90, 90d supply, fill #1

## 2023-08-19 ENCOUNTER — Other Ambulatory Visit (HOSPITAL_COMMUNITY): Payer: Self-pay

## 2023-08-20 ENCOUNTER — Other Ambulatory Visit: Payer: Self-pay

## 2023-08-24 ENCOUNTER — Other Ambulatory Visit (HOSPITAL_COMMUNITY): Payer: Self-pay

## 2023-08-24 ENCOUNTER — Encounter (HOSPITAL_COMMUNITY): Payer: Self-pay

## 2023-08-29 ENCOUNTER — Other Ambulatory Visit (HOSPITAL_COMMUNITY): Payer: Self-pay

## 2023-08-29 ENCOUNTER — Other Ambulatory Visit: Payer: Self-pay

## 2023-08-30 ENCOUNTER — Other Ambulatory Visit (HOSPITAL_COMMUNITY): Payer: Self-pay

## 2023-08-30 ENCOUNTER — Other Ambulatory Visit: Payer: Self-pay

## 2023-08-30 MED ORDER — BUDESONIDE-FORMOTEROL FUMARATE 80-4.5 MCG/ACT IN AERO
2.0000 | INHALATION_SPRAY | Freq: Four times a day (QID) | RESPIRATORY_TRACT | 2 refills | Status: DC | PRN
  Filled 2023-08-30: qty 10.2, 30d supply, fill #0

## 2023-09-17 ENCOUNTER — Other Ambulatory Visit: Payer: Self-pay

## 2023-09-17 ENCOUNTER — Other Ambulatory Visit (HOSPITAL_COMMUNITY): Payer: Self-pay

## 2023-09-17 MED ORDER — ESTRADIOL 0.5 MG PO TABS
0.2500 mg | ORAL_TABLET | Freq: Every day | ORAL | 0 refills | Status: DC
Start: 2023-09-17 — End: 2023-11-21
  Filled 2023-09-17: qty 45, 90d supply, fill #0

## 2023-10-08 ENCOUNTER — Other Ambulatory Visit: Payer: Self-pay | Admitting: Obstetrics and Gynecology

## 2023-10-08 DIAGNOSIS — N6310 Unspecified lump in the right breast, unspecified quadrant: Secondary | ICD-10-CM

## 2023-10-08 DIAGNOSIS — R928 Other abnormal and inconclusive findings on diagnostic imaging of breast: Secondary | ICD-10-CM

## 2023-10-08 DIAGNOSIS — H35371 Puckering of macula, right eye: Secondary | ICD-10-CM | POA: Diagnosis not present

## 2023-10-16 ENCOUNTER — Other Ambulatory Visit (HOSPITAL_COMMUNITY): Payer: Self-pay

## 2023-10-17 ENCOUNTER — Ambulatory Visit
Admission: RE | Admit: 2023-10-17 | Discharge: 2023-10-17 | Disposition: A | Discharge status: Home / Self Care | Source: Ambulatory Visit | Attending: Obstetrics and Gynecology | Admitting: Obstetrics and Gynecology

## 2023-10-17 ENCOUNTER — Other Ambulatory Visit: Payer: Self-pay | Admitting: Obstetrics and Gynecology

## 2023-10-17 DIAGNOSIS — R928 Other abnormal and inconclusive findings on diagnostic imaging of breast: Secondary | ICD-10-CM

## 2023-10-17 DIAGNOSIS — N6310 Unspecified lump in the right breast, unspecified quadrant: Secondary | ICD-10-CM

## 2023-10-17 DIAGNOSIS — N6311 Unspecified lump in the right breast, upper outer quadrant: Secondary | ICD-10-CM | POA: Diagnosis not present

## 2023-10-17 DIAGNOSIS — Z170 Estrogen receptor positive status [ER+]: Secondary | ICD-10-CM | POA: Diagnosis not present

## 2023-10-17 DIAGNOSIS — C50411 Malignant neoplasm of upper-outer quadrant of right female breast: Secondary | ICD-10-CM | POA: Diagnosis not present

## 2023-10-17 HISTORY — PX: BREAST BIOPSY: SHX20

## 2023-10-25 ENCOUNTER — Other Ambulatory Visit (HOSPITAL_COMMUNITY): Payer: Self-pay

## 2023-10-31 ENCOUNTER — Other Ambulatory Visit: Payer: Self-pay | Admitting: General Surgery

## 2023-10-31 DIAGNOSIS — C50911 Malignant neoplasm of unspecified site of right female breast: Secondary | ICD-10-CM | POA: Diagnosis not present

## 2023-10-31 DIAGNOSIS — Z6831 Body mass index (BMI) 31.0-31.9, adult: Secondary | ICD-10-CM | POA: Diagnosis not present

## 2023-10-31 DIAGNOSIS — I10 Essential (primary) hypertension: Secondary | ICD-10-CM | POA: Diagnosis not present

## 2023-10-31 DIAGNOSIS — C50411 Malignant neoplasm of upper-outer quadrant of right female breast: Secondary | ICD-10-CM

## 2023-10-31 DIAGNOSIS — N951 Menopausal and female climacteric states: Secondary | ICD-10-CM | POA: Diagnosis not present

## 2023-10-31 DIAGNOSIS — F330 Major depressive disorder, recurrent, mild: Secondary | ICD-10-CM | POA: Diagnosis not present

## 2023-10-31 DIAGNOSIS — Z170 Estrogen receptor positive status [ER+]: Secondary | ICD-10-CM | POA: Diagnosis not present

## 2023-10-31 DIAGNOSIS — E669 Obesity, unspecified: Secondary | ICD-10-CM | POA: Diagnosis not present

## 2023-10-31 DIAGNOSIS — J4520 Mild intermittent asthma, uncomplicated: Secondary | ICD-10-CM | POA: Diagnosis not present

## 2023-10-31 DIAGNOSIS — E782 Mixed hyperlipidemia: Secondary | ICD-10-CM | POA: Diagnosis not present

## 2023-10-31 DIAGNOSIS — R7303 Prediabetes: Secondary | ICD-10-CM | POA: Diagnosis not present

## 2023-11-01 ENCOUNTER — Other Ambulatory Visit: Payer: Self-pay | Admitting: General Surgery

## 2023-11-01 ENCOUNTER — Telehealth: Payer: Self-pay | Admitting: Radiation Oncology

## 2023-11-01 DIAGNOSIS — Z170 Estrogen receptor positive status [ER+]: Secondary | ICD-10-CM

## 2023-11-01 LAB — SURGICAL PATHOLOGY

## 2023-11-01 NOTE — Telephone Encounter (Signed)
Called patient to schedule a consultation w. Dr. Squire. No answer, LVM for a return call.  

## 2023-11-02 ENCOUNTER — Telehealth: Payer: Self-pay | Admitting: Radiation Oncology

## 2023-11-02 NOTE — Telephone Encounter (Signed)
Called patient to schedule a consultation w. Dr. Squire. No answer, LVM for a return call.  

## 2023-11-05 ENCOUNTER — Telehealth: Payer: Self-pay | Admitting: Radiation Oncology

## 2023-11-05 NOTE — Telephone Encounter (Addendum)
 Called patient to schedule a consultation w. Dr. Basilio Cairo. Spoke w. Patient's spouse, he advised to give patient a call 4/1 in the afternoon for scheduling.

## 2023-11-06 ENCOUNTER — Telehealth: Payer: Self-pay | Admitting: Radiation Oncology

## 2023-11-06 DIAGNOSIS — L720 Epidermal cyst: Secondary | ICD-10-CM | POA: Diagnosis not present

## 2023-11-06 DIAGNOSIS — D225 Melanocytic nevi of trunk: Secondary | ICD-10-CM | POA: Diagnosis not present

## 2023-11-06 DIAGNOSIS — Z85828 Personal history of other malignant neoplasm of skin: Secondary | ICD-10-CM | POA: Diagnosis not present

## 2023-11-06 DIAGNOSIS — D2239 Melanocytic nevi of other parts of face: Secondary | ICD-10-CM | POA: Diagnosis not present

## 2023-11-06 DIAGNOSIS — L821 Other seborrheic keratosis: Secondary | ICD-10-CM | POA: Diagnosis not present

## 2023-11-06 DIAGNOSIS — D1801 Hemangioma of skin and subcutaneous tissue: Secondary | ICD-10-CM | POA: Diagnosis not present

## 2023-11-06 DIAGNOSIS — D220 Melanocytic nevi of lip: Secondary | ICD-10-CM | POA: Diagnosis not present

## 2023-11-06 DIAGNOSIS — L918 Other hypertrophic disorders of the skin: Secondary | ICD-10-CM | POA: Diagnosis not present

## 2023-11-06 DIAGNOSIS — D2262 Melanocytic nevi of left upper limb, including shoulder: Secondary | ICD-10-CM | POA: Diagnosis not present

## 2023-11-06 DIAGNOSIS — L814 Other melanin hyperpigmentation: Secondary | ICD-10-CM | POA: Diagnosis not present

## 2023-11-06 NOTE — Pre-Procedure Instructions (Signed)
 Surgical Instructions   Your procedure is scheduled on November 14, 2023. Report to [facility] Main Entrance "A" at 2:00 P.M., then check in with the Admitting office. Any questions or running late day of surgery: call [phone]  Questions prior to your surgery date: call [phone], Monday-Friday, 8am-4pm. If you experience any cold or flu symptoms such as cough, fever, chills, shortness of breath, etc. between now and your scheduled surgery, please notify us at the above number.     Remember:  Do not eat after midnight the night before your surgery   You may drink clear liquids until 1:00 PM the morning of your surgery.   Clear liquids allowed are: Water, Non-Citrus Juices (without pulp), Carbonated Beverages, Clear Tea (no milk, honey, etc.), Black Coffee Only (NO MILK, CREAM OR POWDERED CREAMER of any kind), and Gatorade.  Patient Instructions  The night before surgery:  No food after midnight. ONLY clear liquids after midnight  The day of surgery (if you have diabetes): Drink ONE (1) 12 oz G2 given to you in your pre admission testing appointment by 1:00 PM the morning of surgery. Drink in one sitting. Do not sip.  This drink was given to you during your hospital  pre-op appointment visit.  Nothing else to drink after completing the  12 oz bottle of G2.         If you have questions, please contact your surgeon's office.    Take these medicines the morning of surgery with A SIP OF WATER: atorvastatin (LIPITOR)  DULoxetine (CYMBALTA)   estradiol (ESTRACE)    May take these medicines IF NEEDED: albuterol (VENTOLIN HFA) inhaler - please bring inhaler with you morning of surgery budesonide-formoterol (SYMBICORT) 80-4.5 MCG/ACT inhaler  cyclobenzaprine (FLEXERIL)  Fluticasone-Salmeterol (ADVAIR DISKUS)  ondansetron (ZOFRAN)  oxyCODONE-acetaminophen (PERCOCET)    One week prior to surgery, STOP taking any Aspirin (unless otherwise instructed by your surgeon) Aleve,  Naproxen, Ibuprofen, Motrin, Advil, Goody's, BC's, all herbal medications, fish oil, and non-prescription vitamins. This includes your medication: Diclofenac Sodium (VOLTAREN PO)    WHAT DO I DO ABOUT MY DIABETES MEDICATION?   Do not take glipiZIDE (GLUCOTROL XL) the evening before surgery or the morning of surgery.   HOW TO MANAGE YOUR DIABETES BEFORE AND AFTER SURGERY  Why is it important to control my blood sugar before and after surgery? Improving blood sugar levels before and after surgery helps healing and can limit problems. A way of improving blood sugar control is eating a healthy diet by:  Eating less sugar and carbohydrates  Increasing activity/exercise  Talking with your doctor about reaching your blood sugar goals High blood sugars (greater than 180 mg/dL) can raise your risk of infections and slow your recovery, so you will need to focus on controlling your diabetes during the weeks before surgery. Make sure that the doctor who takes care of your diabetes knows about your planned surgery including the date and location.  How do I manage my blood sugar before surgery? Check your blood sugar at least 4 times a day, starting 2 days before surgery, to make sure that the level is not too high or low.  Check your blood sugar the morning of your surgery when you wake up and every 2 hours until you get to the Short Stay unit.  If your blood sugar is less than 70 mg/dL, you will need to treat for low blood sugar: Do not take insulin. Treat a low blood sugar (less than 70 mg/dL) with  cup of clear juice (cranberry or apple), 4 glucose tablets, OR glucose gel. Recheck blood sugar in 15 minutes after treatment (to make sure it is greater than 70 mg/dL). If your blood sugar is not greater than 70 mg/dL on recheck, call [phone] for further instructions. Report your blood sugar to the short stay nurse when you get to Short Stay.  If you are admitted to the hospital after  surgery: Your blood sugar will be checked by the staff and you will probably be given insulin after surgery (instead of oral diabetes medicines) to make sure you have good blood sugar levels. The goal for blood sugar control after surgery is 80-180 mg/dL.                      Do NOT Smoke (Tobacco/Vaping) for 24 hours prior to your procedure.  If you use a CPAP at night, you may bring your mask/headgear for your overnight stay.   You will be asked to remove any contacts, glasses, piercing's, hearing aid's, dentures/partials prior to surgery. Please bring cases for these items if needed.    Patients discharged the day of surgery will not be allowed to drive home, and someone needs to stay with them for 24 hours.  SURGICAL WAITING ROOM VISITATION Patients may have no more than 2 support people in the waiting area - these visitors may rotate.   Pre-op nurse will coordinate an appropriate time for 1 ADULT support person, who may not rotate, to accompany patient in pre-op.  Children under the age of 43 must have an adult with them who is not the patient and must remain in the main waiting area with an adult.  If the patient needs to stay at the hospital during part of their recovery, the visitor guidelines for inpatient rooms apply.  Please refer to the [facility] website for the visitor guidelines for any additional information.   If you received a COVID test during your pre-op visit  it is requested that you wear a mask when out in public, stay away from anyone that may not be feeling well and notify your surgeon if you develop symptoms. If you have been in contact with anyone that has tested positive in the last 10 days please notify you surgeon.      Pre-operative CHG Bathing Instructions   You can play a key role in reducing the risk of infection after surgery. Your skin needs to be as free of germs as possible. You can reduce the number of germs on your skin by washing with CHG  (chlorhexidine gluconate) soap before surgery. CHG is an antiseptic soap that kills germs and continues to kill germs even after washing.   DO NOT use if you have an allergy to chlorhexidine/CHG or antibacterial soaps. If your skin becomes reddened or irritated, stop using the CHG and notify one of our RNs at [phone].              TAKE A SHOWER THE NIGHT BEFORE SURGERY AND THE DAY OF SURGERY    Please keep in mind the following:  DO NOT shave, including legs and underarms, 48 hours prior to surgery.   You may shave your face before/day of surgery.  Place clean sheets on your bed the night before surgery Use a clean washcloth (not used since being washed) for each shower. DO NOT sleep with pet's night before surgery.  CHG Shower Instructions:  Oncologist and private area with  normal soap. If you choose to wash your hair, wash first with your normal shampoo.  After you use shampoo/soap, rinse your hair and body thoroughly to remove shampoo/soap residue.  Turn the water OFF and apply half the bottle of CHG soap to a CLEAN washcloth.  Apply CHG soap ONLY FROM YOUR NECK DOWN TO YOUR TOES (washing for 3-5 minutes)  DO NOT use CHG soap on face, private areas, open wounds, or sores.  Pay special attention to the area where your surgery is being performed.  If you are having back surgery, having someone wash your back for you may be helpful. Wait 2 minutes after CHG soap is applied, then you may rinse off the CHG soap.  Pat dry with a clean towel  Put on clean pajamas    Additional instructions for the day of surgery: DO NOT APPLY any lotions, deodorants, cologne, or perfumes.   Do not wear jewelry or makeup Do not wear nail polish, gel polish, artificial nails, or any other type of covering on natural nails (fingers and toes) Do not bring valuables to the hospital. [facility] is not responsible for valuables/personal belongings. Put on clean/comfortable clothes.  Please brush your  teeth.  Ask your nurse before applying any prescription medications to the skin.

## 2023-11-06 NOTE — Telephone Encounter (Signed)
 Called patient to schedule a consultation w. Dr. Basilio Cairo. Patient requested to schedule consult after SX date held on 4/9.

## 2023-11-07 ENCOUNTER — Other Ambulatory Visit: Payer: Self-pay

## 2023-11-07 ENCOUNTER — Inpatient Hospital Stay: Admitting: Hematology and Oncology

## 2023-11-07 ENCOUNTER — Encounter (HOSPITAL_COMMUNITY)
Admission: RE | Admit: 2023-11-07 | Discharge: 2023-11-07 | Disposition: A | Discharge status: Home / Self Care | Source: Ambulatory Visit | Attending: General Surgery | Admitting: General Surgery

## 2023-11-07 ENCOUNTER — Inpatient Hospital Stay

## 2023-11-07 ENCOUNTER — Encounter (HOSPITAL_COMMUNITY): Payer: Self-pay

## 2023-11-07 VITALS — BP 150/84 | HR 103 | Temp 98.3°F | Resp 17 | Ht 61.25 in | Wt 166.3 lb

## 2023-11-07 DIAGNOSIS — Z01810 Encounter for preprocedural cardiovascular examination: Secondary | ICD-10-CM | POA: Insufficient documentation

## 2023-11-07 DIAGNOSIS — Z170 Estrogen receptor positive status [ER+]: Secondary | ICD-10-CM | POA: Insufficient documentation

## 2023-11-07 DIAGNOSIS — Z01818 Encounter for other preprocedural examination: Secondary | ICD-10-CM

## 2023-11-07 HISTORY — DX: Prediabetes: R73.03

## 2023-11-07 NOTE — Progress Notes (Addendum)
 Surgical Instructions     Your procedure is scheduled on November 14, 2023. Report to [facility] Main Entrance "A" at 2:00 P.M., then check in with the Admitting office. Any questions or running late day of surgery: call [phone]   Questions prior to your surgery date: call [phone], Monday-Friday, 8am-4pm. If you experience any cold or flu symptoms such as cough, fever, chills, shortness of breath, etc. between now and your scheduled surgery, please notify us at the above number.            Remember:       Do not eat after midnight the night before your surgery     You may drink clear liquids until 1:00 PM the day of your surgery.   Clear liquids allowed are: Water, Non-Citrus Juices (without pulp), Carbonated Beverages, Clear Tea (no milk, honey, etc.), Black Coffee Only (NO MILK, CREAM OR POWDERED CREAMER of any kind), and Gatorade.   Patient Instructions   The night before surgery:  No food after midnight. ONLY clear liquids after midnight   The day of surgery (if you have diabetes): Drink ONE (1) 12 oz G2 given to you in your pre admission testing appointment by 1:00 PM the day of surgery. Drink in one sitting. Do not sip.  This drink was given to you during your hospital  pre-op appointment visit.  Nothing else to drink after completing the  12 oz bottle of G2.          If you have questions, please contact your surgeon's office.          Take these medicines the morning of surgery with A SIP OF WATER: None.        May take these medicines IF NEEDED: budesonide-formoterol (SYMBICORT) 80-4.5 MCG/ACT inhaler  Fluticasone-Salmeterol (ADVAIR DISKUS)  Omeprazole (Prilosec)       One week prior to surgery, STOP taking any Aspirin (unless otherwise instructed by your surgeon) Aleve, Naproxen, Ibuprofen, Motrin, Advil, Goody's, BC's, all herbal medications, fish oil, and non-prescription vitamins.                         Do NOT Smoke (Tobacco/Vaping) for 24 hours  prior to your procedure.   If you use a CPAP at night, you may bring your mask/headgear for your overnight stay.   You will be asked to remove any contacts, glasses, piercing's, hearing aid's, dentures/partials prior to surgery. Please bring cases for these items if needed.    Patients discharged the day of surgery will not be allowed to drive home, and someone needs to stay with them for 24 hours.   SURGICAL WAITING ROOM VISITATION Patients may have no more than 2 support people in the waiting area - these visitors may rotate.   Pre-op nurse will coordinate an appropriate time for 1 ADULT support person, who may not rotate, to accompany patient in pre-op.  Children under the age of 23 must have an adult with them who is not the patient and must remain in the main waiting area with an adult.   If the patient needs to stay at the hospital during part of their recovery, the visitor guidelines for inpatient rooms apply.   Please refer to the [facility] website for the visitor guidelines for any additional information.     If you received a COVID test during your pre-op visit  it is requested that you wear a mask when out in public, stay away  from anyone that may not be feeling well and notify your surgeon if you develop symptoms. If you have been in contact with anyone that has tested positive in the last 10 days please notify you surgeon.         Pre-operative CHG Bathing Instructions    You can play a key role in reducing the risk of infection after surgery. Your skin needs to be as free of germs as possible. You can reduce the number of germs on your skin by washing with CHG (chlorhexidine gluconate) soap before surgery. CHG is an antiseptic soap that kills germs and continues to kill germs even after washing.    DO NOT use if you have an allergy to chlorhexidine/CHG or antibacterial soaps. If your skin becomes reddened or irritated, stop using the CHG and notify one of our RNs at  [phone].               TAKE A SHOWER THE NIGHT BEFORE SURGERY AND THE DAY OF SURGERY     Please keep in mind the following:  DO NOT shave, including legs and underarms, 48 hours prior to surgery.   You may shave your face before/day of surgery.  Place clean sheets on your bed the night before surgery Use a clean washcloth (not used since being washed) for each shower. DO NOT sleep with pet's night before surgery.   CHG Shower Instructions:  Wash your face and private area with normal soap. If you choose to wash your hair, wash first with your normal shampoo.  After you use shampoo/soap, rinse your hair and body thoroughly to remove shampoo/soap residue.  Turn the water OFF and apply half the bottle of CHG soap to a CLEAN washcloth.  Apply CHG soap ONLY FROM YOUR NECK DOWN TO YOUR TOES (washing for 3-5 minutes)  DO NOT use CHG soap on face, private areas, open wounds, or sores.  Pay special attention to the area where your surgery is being performed.  If you are having back surgery, having someone wash your back for you may be helpful. Wait 2 minutes after CHG soap is applied, then you may rinse off the CHG soap.  Pat dry with a clean towel  Put on clean pajamas     Additional instructions for the day of surgery: DO NOT APPLY any lotions, deodorants, cologne, or perfumes.   Do not wear jewelry or makeup Do not wear nail polish, gel polish, artificial nails, or any other type of covering on natural nails (fingers and toes) Do not bring valuables to the hospital. [facility] is not responsible for valuables/personal belongings. Put on clean/comfortable clothes.  Please brush your teeth.  Ask your nurse before applying any prescription medications to the skin.

## 2023-11-07 NOTE — Progress Notes (Signed)
 PCP - Sigmund Hazel at [facility] Cardiologist - denies  PPM/ICD - denies  Chest x-ray - denies EKG - 11/07/23 Stress Test - 05/30/16 ECHO - 05/30/16 Cardiac Cath - denies  Sleep Study - denies  Pt. Thinks she has pre-diabetes.  A1C was 6.1 on 10/31/23.  Patient does not check blood sugars at home and does not take any medications  Last dose of GLP1 agonist-  n/a GLP1 instructions: n/a  Blood Thinner Instructions: n/a Aspirin Instructions: n/a  ERAS Protcol - clears until 1300 PRE-SURGERY Ensure or G2-  G2 as ordered  COVID TEST- n/a   Anesthesia review: yes - seed placement  Patient denies shortness of breath, fever, cough and chest pain at PAT appointment   All instructions explained to the patient, with a verbal understanding of the material. Patient agrees to go over the instructions while at home for a better understanding. Patient also instructed to self quarantine after being tested for COVID-19. The opportunity to ask questions was provided.

## 2023-11-07 NOTE — Assessment & Plan Note (Deleted)
 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 After reviewing the pathology in detail, we proceeded to discuss the different treatment options between surgery, radiation, chemotherapy, antiestrogen therapies.  Recommendations: 1. Breast conserving surgery followed by 2. Oncotype DX testing to determine if chemotherapy would be of any benefit followed by 3. Adjuvant radiation therapy followed by 4. Adjuvant antiestrogen therapy  Oncotype counseling: I discussed Oncotype DX test. I explained to the patient that this is a 21 gene panel to evaluate patient tumors DNA to calculate recurrence score. This would help determine whether patient has high risk or low risk breast cancer. She understands that if her tumor was found to be high risk, she would benefit from systemic chemotherapy. If low risk, no need of chemotherapy.  Return to clinic after surgery to discuss final pathology report and then determine if Oncotype DX testing will need to be sent.

## 2023-11-08 NOTE — Anesthesia Preprocedure Evaluation (Addendum)
 Anesthesia Evaluation  Patient identified by MRN, date of birth, ID band Patient awake    Reviewed: Allergy & Precautions, NPO status , Patient's Chart, lab work & pertinent test results  Airway Mallampati: IV  TM Distance: >3 FB Neck ROM: Full    Dental no notable dental hx.    Pulmonary asthma , former smoker   Pulmonary exam normal        Cardiovascular hypertension, Pt. on medications Normal cardiovascular exam     Neuro/Psych  PSYCHIATRIC DISORDERS  Depression    negative neurological ROS     GI/Hepatic negative GI ROS, Neg liver ROS,,,  Endo/Other  negative endocrine ROS    Renal/GU negative Renal ROS     Musculoskeletal negative musculoskeletal ROS (+)    Abdominal   Peds  Hematology negative hematology ROS (+)   Anesthesia Other Findings RIGHT BREAST CANCER  Reproductive/Obstetrics                             Anesthesia Physical Anesthesia Plan  ASA: 2  Anesthesia Plan: General   Post-op Pain Management:    Induction: Intravenous  PONV Risk Score and Plan: 3 and Ondansetron, Dexamethasone, Midazolam and Treatment may vary due to age or medical condition  Airway Management Planned: LMA  Additional Equipment:   Intra-op Plan:   Post-operative Plan: Extubation in OR  Informed Consent: I have reviewed the patients History and Physical, chart, labs and discussed the procedure including the risks, benefits and alternatives for the proposed anesthesia with the patient or authorized representative who has indicated his/her understanding and acceptance.     Dental advisory given  Plan Discussed with: CRNA  Anesthesia Plan Comments: ( Hemoglobin A1c 10/31/23: Interpretation: 6.1  CBC with Diff 10/31/2023  WBC 7.3 4.0-11.0 K/ul   RBC 4.87 4.20-5.40 M/uL   HGB 14.3 12.0-16.0 g/dL   HCT 42.5 95.6-38.7 %   MCV 87.8 81.0-99.0 fL   MCH 29.4 27.0-33.0 pg    MCHC 33.5 32.0-36.0 g/dL   RDW 56.4 33.2-95.1 %   PLT 351 150-400 K/uL   MPV 8.6 7.5-10.7 fL   NE% 54.4 43.3-71.9 %   LY% 31.8 16.8-43.5 %   MO% 9.3 4.6-12.4 %   EO% 3.1 0.0-7.8 %   BA% 1.4 0.0-1.0 %   NE# 4.0 1.9-7.2 K/uL   LY# 2.30 1.10-2.70 K/uL   MO# 0.7 0.3-0.8 K/uL   EO# 0.2 0.0-0.6 K/uL   BA# 0.1 0.0-0.1 K/uL   NRBC% 0.10     NRBC# 0.01      Comp Metabolic Panel 10/31/2023 Glucose 99 70-99 mg/dL   BUN 20 8-84 mg/dL   Creatinine 1.66 0.63-0.16 mg/dl   [id number] 76 >57 calc In accordance with recommendations from NKF-ASN Task Force, [facility] has updated its eGFR calc to the 2021 CKD-EDI equation that estimates kidney function without a race variable;Stage 1 > 90 ML/Min plus Albuminuria;Stage 2 60-89 ML/MIN;Stage 3 30-59 ML/MIN;Stage 4 15-29 ML/MIN;Stage 5 <15 ML/MIN Sodium 137 136-145 mmol/L   Potassium 5.0 3.5-5.5 mmol/L   Chloride 99 98-107 mmol/L   CO2 32 22-32 mmol/L   Anion Gap 10.7 6.0-20.0 mmol/L   Calcium 10.8 8.6-10.3 mg/dL   CA-corrected 32.20 2.54-27.06 mg/dL   Protein, Total 7.4 2.3-7.6 g/dL   Albumin 4.8 2.8-3.1 g/dL   TBIL 0.7 5.1-7.6 mg/dL   ALP 93 16-073 U/L   AST 24 0-39 U/L   ALT 23 0-52 U/L    )  Anesthesia Quick Evaluation

## 2023-11-12 ENCOUNTER — Telehealth: Payer: Self-pay | Admitting: Hematology and Oncology

## 2023-11-12 ENCOUNTER — Other Ambulatory Visit (HOSPITAL_COMMUNITY)

## 2023-11-12 NOTE — Telephone Encounter (Signed)
 Marland Kitchen

## 2023-11-13 ENCOUNTER — Other Ambulatory Visit: Payer: Self-pay

## 2023-11-13 ENCOUNTER — Ambulatory Visit
Admission: RE | Admit: 2023-11-13 | Discharge: 2023-11-13 | Disposition: A | Discharge status: Home / Self Care | Source: Ambulatory Visit | Attending: General Surgery | Admitting: General Surgery

## 2023-11-13 ENCOUNTER — Other Ambulatory Visit (HOSPITAL_COMMUNITY): Payer: Self-pay

## 2023-11-13 DIAGNOSIS — C50411 Malignant neoplasm of upper-outer quadrant of right female breast: Secondary | ICD-10-CM | POA: Diagnosis not present

## 2023-11-13 HISTORY — PX: BREAST BIOPSY: SHX20

## 2023-11-13 MED ORDER — DULOXETINE HCL 60 MG PO CPEP
60.0000 mg | ORAL_CAPSULE | Freq: Every day | ORAL | 1 refills | Status: DC
  Filled 2023-11-13 – 2023-11-20 (×2): qty 90, 90d supply, fill #0
  Filled 2024-02-13: qty 90, 90d supply, fill #1

## 2023-11-14 ENCOUNTER — Other Ambulatory Visit: Payer: Self-pay

## 2023-11-14 ENCOUNTER — Ambulatory Visit
Admission: RE | Admit: 2023-11-14 | Discharge: 2023-11-14 | Disposition: A | Discharge status: Home / Self Care | Source: Ambulatory Visit | Attending: General Surgery | Admitting: General Surgery

## 2023-11-14 ENCOUNTER — Ambulatory Visit (HOSPITAL_BASED_OUTPATIENT_CLINIC_OR_DEPARTMENT_OTHER): Payer: Self-pay | Admitting: Anesthesiology

## 2023-11-14 ENCOUNTER — Encounter

## 2023-11-14 ENCOUNTER — Encounter (HOSPITAL_BASED_OUTPATIENT_CLINIC_OR_DEPARTMENT_OTHER): Payer: Self-pay | Admitting: General Surgery

## 2023-11-14 ENCOUNTER — Ambulatory Visit (HOSPITAL_BASED_OUTPATIENT_CLINIC_OR_DEPARTMENT_OTHER): Payer: Self-pay | Admitting: Physician Assistant

## 2023-11-14 ENCOUNTER — Ambulatory Visit (HOSPITAL_BASED_OUTPATIENT_CLINIC_OR_DEPARTMENT_OTHER)
Admission: RE | Admit: 2023-11-14 | Discharge: 2023-11-14 | Disposition: A | Discharge status: Home / Self Care | Attending: General Surgery | Admitting: General Surgery

## 2023-11-14 ENCOUNTER — Encounter (HOSPITAL_BASED_OUTPATIENT_CLINIC_OR_DEPARTMENT_OTHER)
Admission: RE | Disposition: A | Discharge status: Home / Self Care | Payer: Self-pay | Source: Home / Self Care | Attending: General Surgery

## 2023-11-14 ENCOUNTER — Other Ambulatory Visit (HOSPITAL_COMMUNITY): Payer: Self-pay

## 2023-11-14 DIAGNOSIS — C50411 Malignant neoplasm of upper-outer quadrant of right female breast: Secondary | ICD-10-CM

## 2023-11-14 DIAGNOSIS — N6489 Other specified disorders of breast: Secondary | ICD-10-CM | POA: Insufficient documentation

## 2023-11-14 DIAGNOSIS — Z87891 Personal history of nicotine dependence: Secondary | ICD-10-CM | POA: Insufficient documentation

## 2023-11-14 DIAGNOSIS — J45909 Unspecified asthma, uncomplicated: Secondary | ICD-10-CM | POA: Diagnosis not present

## 2023-11-14 DIAGNOSIS — F32A Depression, unspecified: Secondary | ICD-10-CM | POA: Insufficient documentation

## 2023-11-14 DIAGNOSIS — Z170 Estrogen receptor positive status [ER+]: Secondary | ICD-10-CM | POA: Diagnosis not present

## 2023-11-14 DIAGNOSIS — Z79899 Other long term (current) drug therapy: Secondary | ICD-10-CM | POA: Diagnosis not present

## 2023-11-14 DIAGNOSIS — Z8249 Family history of ischemic heart disease and other diseases of the circulatory system: Secondary | ICD-10-CM | POA: Diagnosis not present

## 2023-11-14 DIAGNOSIS — Z1732 Human epidermal growth factor receptor 2 negative status: Secondary | ICD-10-CM | POA: Diagnosis not present

## 2023-11-14 DIAGNOSIS — C50911 Malignant neoplasm of unspecified site of right female breast: Secondary | ICD-10-CM | POA: Diagnosis not present

## 2023-11-14 DIAGNOSIS — N62 Hypertrophy of breast: Secondary | ICD-10-CM | POA: Diagnosis not present

## 2023-11-14 DIAGNOSIS — Z01818 Encounter for other preprocedural examination: Secondary | ICD-10-CM

## 2023-11-14 DIAGNOSIS — Z1721 Progesterone receptor positive status: Secondary | ICD-10-CM | POA: Diagnosis not present

## 2023-11-14 DIAGNOSIS — I10 Essential (primary) hypertension: Secondary | ICD-10-CM | POA: Diagnosis not present

## 2023-11-14 DIAGNOSIS — N6031 Fibrosclerosis of right breast: Secondary | ICD-10-CM | POA: Diagnosis not present

## 2023-11-14 HISTORY — PX: BREAST LUMPECTOMY WITH RADIOACTIVE SEED LOCALIZATION: SHX6424

## 2023-11-14 SURGERY — BREAST LUMPECTOMY WITH RADIOACTIVE SEED LOCALIZATION
Anesthesia: General | Site: Breast | Laterality: Right

## 2023-11-14 MED ORDER — CEFAZOLIN SODIUM-DEXTROSE 2-4 GM/100ML-% IV SOLN
2.0000 g | INTRAVENOUS | Status: AC
  Administered 2023-11-14: 2 g via INTRAVENOUS

## 2023-11-14 MED ORDER — OXYCODONE HCL 5 MG PO TABS
5.0000 mg | ORAL_TABLET | Freq: Once | ORAL | Status: AC
  Administered 2023-11-14: 5 mg via ORAL

## 2023-11-14 MED ORDER — ONDANSETRON HCL 4 MG/2ML IJ SOLN
INTRAMUSCULAR | Status: DC | PRN
  Administered 2023-11-14: 4 mg via INTRAVENOUS

## 2023-11-14 MED ORDER — ENSURE PRE-SURGERY PO LIQD
296.0000 mL | Freq: Once | ORAL | Status: DC
Start: 2023-11-15 — End: 2023-11-14

## 2023-11-14 MED ORDER — ACETAMINOPHEN 500 MG PO TABS
1000.0000 mg | ORAL_TABLET | ORAL | Status: AC
  Administered 2023-11-14: 1000 mg via ORAL

## 2023-11-14 MED ORDER — CEFAZOLIN SODIUM-DEXTROSE 2-4 GM/100ML-% IV SOLN
INTRAVENOUS | Status: AC
  Filled 2023-11-14: qty 100

## 2023-11-14 MED ORDER — LACTATED RINGERS IV SOLN: INTRAVENOUS | Status: DC

## 2023-11-14 MED ORDER — MIDAZOLAM HCL 2 MG/2ML IJ SOLN
INTRAMUSCULAR | Status: AC
  Filled 2023-11-14: qty 2

## 2023-11-14 MED ORDER — FENTANYL CITRATE (PF) 100 MCG/2ML IJ SOLN: 25.0000 ug | INTRAMUSCULAR | Status: DC | PRN

## 2023-11-14 MED ORDER — DEXAMETHASONE SODIUM PHOSPHATE 10 MG/ML IJ SOLN
INTRAMUSCULAR | Status: DC | PRN
Start: 2023-11-14 — End: 2023-11-14
  Administered 2023-11-14: 6 mg via INTRAVENOUS

## 2023-11-14 MED ORDER — PHENYLEPHRINE 80 MCG/ML (10ML) SYRINGE FOR IV PUSH (FOR BLOOD PRESSURE SUPPORT)
PREFILLED_SYRINGE | INTRAVENOUS | Status: AC
  Filled 2023-11-14: qty 10

## 2023-11-14 MED ORDER — CHLORHEXIDINE GLUCONATE 0.12 % MT SOLN
15.0000 mL | Freq: Once | OROMUCOSAL | Status: DC
  Filled 2023-11-14: qty 15

## 2023-11-14 MED ORDER — ONDANSETRON HCL 4 MG/2ML IJ SOLN: 4.0000 mg | Freq: Once | INTRAMUSCULAR | Status: DC | PRN

## 2023-11-14 MED ORDER — FENTANYL CITRATE (PF) 100 MCG/2ML IJ SOLN
INTRAMUSCULAR | Status: AC
Start: 2023-11-14 — End: ?
  Filled 2023-11-14: qty 2

## 2023-11-14 MED ORDER — MIDAZOLAM HCL 5 MG/5ML IJ SOLN
INTRAMUSCULAR | Status: DC | PRN
Start: 2023-11-14 — End: 2023-11-14
  Administered 2023-11-14: 2 mg via INTRAVENOUS

## 2023-11-14 MED ORDER — CHLORHEXIDINE GLUCONATE CLOTH 2 % EX PADS: 6.0000 | MEDICATED_PAD | Freq: Once | CUTANEOUS | Status: DC

## 2023-11-14 MED ORDER — ACETAMINOPHEN 500 MG PO TABS
ORAL_TABLET | ORAL | Status: AC
  Filled 2023-11-14: qty 2

## 2023-11-14 MED ORDER — ORAL CARE MOUTH RINSE: 15.0000 mL | Freq: Once | OROMUCOSAL | Status: DC

## 2023-11-14 MED ORDER — PHENYLEPHRINE 80 MCG/ML (10ML) SYRINGE FOR IV PUSH (FOR BLOOD PRESSURE SUPPORT)
PREFILLED_SYRINGE | INTRAVENOUS | Status: DC | PRN
  Administered 2023-11-14 (×6): 160 ug via INTRAVENOUS

## 2023-11-14 MED ORDER — LIDOCAINE 2% (20 MG/ML) 5 ML SYRINGE
INTRAMUSCULAR | Status: DC | PRN
  Administered 2023-11-14: 60 mg via INTRAVENOUS

## 2023-11-14 MED ORDER — OXYCODONE HCL 5 MG PO TABS
ORAL_TABLET | ORAL | Status: AC
  Filled 2023-11-14: qty 1

## 2023-11-14 MED ORDER — ONDANSETRON HCL 4 MG/2ML IJ SOLN
INTRAMUSCULAR | Status: AC
  Filled 2023-11-14: qty 2

## 2023-11-14 MED ORDER — FENTANYL CITRATE (PF) 100 MCG/2ML IJ SOLN
INTRAMUSCULAR | Status: DC | PRN
  Administered 2023-11-14 (×2): 50 ug via INTRAVENOUS

## 2023-11-14 MED ORDER — DEXAMETHASONE SODIUM PHOSPHATE 10 MG/ML IJ SOLN
INTRAMUSCULAR | Status: AC
  Filled 2023-11-14: qty 1

## 2023-11-14 MED ORDER — BUPIVACAINE HCL (PF) 0.25 % IJ SOLN
INTRAMUSCULAR | Status: DC | PRN
  Administered 2023-11-14: 10 mL

## 2023-11-14 MED ORDER — AMISULPRIDE (ANTIEMETIC) 5 MG/2ML IV SOLN: 10.0000 mg | Freq: Once | INTRAVENOUS | Status: DC | PRN

## 2023-11-14 MED ORDER — LIDOCAINE 2% (20 MG/ML) 5 ML SYRINGE
INTRAMUSCULAR | Status: AC
  Filled 2023-11-14: qty 5

## 2023-11-14 MED ORDER — PROPOFOL 10 MG/ML IV BOLUS
INTRAVENOUS | Status: DC | PRN
  Administered 2023-11-14: 200 mg via INTRAVENOUS

## 2023-11-14 SURGICAL SUPPLY — 48 items
APPLIER CLIP 9.375 MED OPEN (MISCELLANEOUS) IMPLANT
BINDER BREAST LRG (GAUZE/BANDAGES/DRESSINGS) IMPLANT
BINDER BREAST MEDIUM (GAUZE/BANDAGES/DRESSINGS) IMPLANT
BINDER BREAST XLRG (GAUZE/BANDAGES/DRESSINGS) IMPLANT
BINDER BREAST XXLRG (GAUZE/BANDAGES/DRESSINGS) IMPLANT
BLADE SURG 15 STRL LF DISP TIS (BLADE) ×1 IMPLANT
CANISTER SUC SOCK COL 7IN (MISCELLANEOUS) IMPLANT
CANISTER SUCT 1200ML W/VALVE (MISCELLANEOUS) IMPLANT
CHLORAPREP W/TINT 26 (MISCELLANEOUS) ×1 IMPLANT
CLIP APPLIE 9.375 MED OPEN (MISCELLANEOUS) IMPLANT
CLIP TI WIDE RED SMALL 6 (CLIP) IMPLANT
COVER BACK TABLE 60X90IN (DRAPES) ×1 IMPLANT
COVER MAYO STAND STRL (DRAPES) ×1 IMPLANT
COVER PROBE CYLINDRICAL 5X96 (MISCELLANEOUS) ×1 IMPLANT
DERMABOND ADVANCED .7 DNX12 (GAUZE/BANDAGES/DRESSINGS) ×1 IMPLANT
DRAPE LAPAROSCOPIC ABDOMINAL (DRAPES) ×1 IMPLANT
DRAPE UTILITY XL STRL (DRAPES) ×1 IMPLANT
DRSG TEGADERM 4X4.75 (GAUZE/BANDAGES/DRESSINGS) IMPLANT
ELECT COATED BLADE 2.86 ST (ELECTRODE) ×1 IMPLANT
ELECT REM PT RETURN 9FT ADLT (ELECTROSURGICAL) ×1 IMPLANT
ELECTRODE REM PT RTRN 9FT ADLT (ELECTROSURGICAL) ×1 IMPLANT
GAUZE SPONGE 4X4 12PLY STRL LF (GAUZE/BANDAGES/DRESSINGS) IMPLANT
GLOVE BIO SURGEON STRL SZ7 (GLOVE) ×2 IMPLANT
GLOVE BIOGEL PI IND STRL 7.5 (GLOVE) ×1 IMPLANT
GOWN STRL REUS W/ TWL LRG LVL3 (GOWN DISPOSABLE) ×2 IMPLANT
HEMOSTAT ARISTA ABSORB 3G PWDR (HEMOSTASIS) IMPLANT
KIT MARKER MARGIN INK (KITS) ×1 IMPLANT
NDL HYPO 25X1 1.5 SAFETY (NEEDLE) ×1 IMPLANT
NEEDLE HYPO 25X1 1.5 SAFETY (NEEDLE) ×1 IMPLANT
NS IRRIG 1000ML POUR BTL (IV SOLUTION) IMPLANT
PACK BASIN DAY SURGERY FS (CUSTOM PROCEDURE TRAY) ×1 IMPLANT
PENCIL SMOKE EVACUATOR (MISCELLANEOUS) ×1 IMPLANT
RETRACTOR ONETRAX LX 90X20 (MISCELLANEOUS) IMPLANT
SLEEVE SCD COMPRESS KNEE MED (STOCKING) ×1 IMPLANT
SPIKE FLUID TRANSFER (MISCELLANEOUS) IMPLANT
SPONGE T-LAP 4X18 ~~LOC~~+RFID (SPONGE) ×1 IMPLANT
STRIP CLOSURE SKIN 1/2X4 (GAUZE/BANDAGES/DRESSINGS) ×1 IMPLANT
SUT MNCRL AB 4-0 PS2 18 (SUTURE) ×1 IMPLANT
SUT MON AB 5-0 PS2 18 (SUTURE) IMPLANT
SUT SILK 2 0 SH (SUTURE) IMPLANT
SUT VIC AB 2-0 SH 27XBRD (SUTURE) ×1 IMPLANT
SUT VIC AB 3-0 SH 27X BRD (SUTURE) ×1 IMPLANT
SUT VIC AB 5-0 PS2 18 (SUTURE) IMPLANT
SYR CONTROL 10ML LL (SYRINGE) ×1 IMPLANT
TOWEL GREEN STERILE FF (TOWEL DISPOSABLE) ×1 IMPLANT
TRAY FAXITRON CT DISP (TRAY / TRAY PROCEDURE) ×1 IMPLANT
TUBE CONNECTING 20X1/4 (TUBING) IMPLANT
YANKAUER SUCT BULB TIP NO VENT (SUCTIONS) IMPLANT

## 2023-11-14 NOTE — Anesthesia Postprocedure Evaluation (Signed)
 Anesthesia Post Note  Patient: [name]  Procedure(s) Performed: BREAST LUMPECTOMY WITH RADIOACTIVE SEED LOCALIZATION (Right: Breast)     Patient location during evaluation: PACU Anesthesia Type: General Level of consciousness: awake and alert Pain management: pain level controlled Vital Signs Assessment: post-procedure vital signs reviewed and stable Respiratory status: spontaneous breathing, nonlabored ventilation and respiratory function stable Cardiovascular status: blood pressure returned to baseline and stable Postop Assessment: no apparent nausea or vomiting Anesthetic complications: no   No notable events documented.  Last Vitals:  Vitals:   11/14/23 1500 11/14/23 1515  BP: 108/81 139/81  Pulse: 97 88  Resp: 15 18  Temp:  (!) 36.2 C  SpO2: 100% 97%    Last Pain:  Vitals:   11/14/23 1515  TempSrc: Temporal  PainSc: 4                  Collene Schlichter

## 2023-11-14 NOTE — H&P (Signed)
 68 year old female who has no prior breast history. She has no family history at all either. She is otherwise pretty healthy. This was a mass that was palpated by her gynecologist and she also had a mammogram that showed a right breast asymmetry. She has B density breast tissue in the upper outer quadrant on mammography there is a 12 mm mass. On ultrasound this measures 9 x 9 x 7 mm. Her axillary ultrasound is negative. This on biopsy is an invasive mammary carcinoma. Pathology reported they were doing an E-cadherin but have not reported this yet and we are asking them to do this. Her estrogen receptor is positive at 60%, progesterone receptor positive at 95%, HER2 negative and the Ki-67 is 5%. She is here with her husband today to discuss her options.  Review of Systems: A complete review of systems was obtained from the patient. I have reviewed this information and discussed as appropriate with the patient. See HPI as well for other ROS.  Review of Systems  All other systems reviewed and are negative.  Medical History: Past Medical History:  Diagnosis Date  Asthma, unspecified asthma severity, unspecified whether complicated, unspecified whether persistent (HHS-HCC)  History of cancer  Hypertension   Past Surgical History:  Procedure Laterality Date  HYSTERECTOMY   Allergies  Allergen Reactions  Doxycycline Nausea and Other (See Comments)  Other reaction(s): Other (See Comments)  Current Outpatient Medications on File Prior to Visit  Medication Sig Dispense Refill  atorvastatin (LIPITOR) 40 MG tablet Take 40 mg by mouth once daily  benzonatate (TESSALON) 100 MG capsule Take by mouth  DULoxetine (CYMBALTA) 60 MG DR capsule Take 1 capsule by mouth once daily  estradioL (ESTRACE) 0.5 MG tablet Take 0.5 tablets (0.25 mg total) by mouth daily.  losartan (COZAAR) 25 MG tablet 1 tablet Orally Once a day   Family History  Problem Relation Age of Onset  Hyperlipidemia (Elevated  cholesterol) Mother  Skin cancer Father  Coronary Artery Disease (Blocked arteries around heart) Father    Social History   Tobacco Use  Smoking Status Former  Types: Cigarettes  Start date: 1984  Smokeless Tobacco Never  Marital status: Married  Tobacco Use  Smoking status: Former  Types: Cigarettes  Start date: 1984  Smokeless tobacco: Never  Substance and Sexual Activity  Alcohol use: Never  Drug use: Never   Objective:   Vitals:  10/31/23 1456 10/31/23 1457  BP: (!) 145/79  Pulse: (!) 117  Temp: 36.3 C (97.4 F)  SpO2: 99%  Weight: 75 kg (165 lb 6.4 oz)  Height: 156.2 cm (5' 1.5")  PainSc: 0-No pain  PainLoc: Breast   Body mass index is 30.75 kg/m.  Physical Exam Vitals reviewed.  Constitutional:  Appearance: Normal appearance.  Chest:  Breasts: Right: Mass present. No inverted nipple or nipple discharge.  Left: No inverted nipple, mass or nipple discharge.  Comments: Vague ruoq mass Lymphadenopathy:  Upper Body:  Right upper body: No supraclavicular or axillary adenopathy.  Left upper body: No supraclavicular or axillary adenopathy.  Neurological:  Mental Status: She is alert.   Assessment and Plan:   Malignant neoplasm of upper-outer quadrant of right breast in female, estrogen receptor positive (CMS/HHS-HCC)  Right breast seed guided lumpectomy  We discussed the staging and pathophysiology of breast cancer. We discussed all of the different options for treatment for breast cancer including surgery, chemotherapy, radiation therapy, and antiestrogen therapy.  We discussed a sentinel lymph node biopsy.from local guidelines based on our review of  SOUND and INSEMA she can omit sn biopsy. We discussed this today and she is agreeable.   We discussed use of oncotype to determine chemotherapy.  We discussed the options for treatment of the breast cancer which included lumpectomy versus a mastectomy. We discussed the performance of the lumpectomy with  radioactive seed placement. We discussed a 5-10% chance of a positive margin requiring reexcision in the operating room. We also discussed that she will likely need radiation therapy if she undergoes lumpectomy. We discussed mastectomy and the postoperative care for that as well. Mastectomy can be followed by reconstruction. The decision for lumpectomy vs mastectomy has no impact on decision for chemotherapy. Most mastectomy patients will not need radiation therapy. We discussed that there is no difference in her survival whether she undergoes lumpectomy with radiation therapy or antiestrogen therapy versus a mastectomy. There is also no real difference between her recurrence in the breast.  We discussed the risks of operation including bleeding, infection, possible reoperation. She understands her further therapy will be based on what her stages at the time of her operation.

## 2023-11-14 NOTE — Interval H&P Note (Signed)
 History and Physical Interval Note:  11/14/2023 12:47 PM  [name]  has presented today for surgery, with the diagnosis of RIGHT BREAST CANCER.  The various methods of treatment have been discussed with the patient and family. After consideration of risks, benefits and other options for treatment, the patient has consented to  Procedure(s) with comments: BREAST LUMPECTOMY WITH RADIOACTIVE SEED LOCALIZATION (Right) - LMA, Right lumpectomy as a surgical intervention.  The patient's history has been reviewed, patient examined, no change in status, stable for surgery.  I have reviewed the patient's chart and labs.  Questions were answered to the patient's satisfaction.     Emelia Loron

## 2023-11-14 NOTE — Discharge Instructions (Addendum)
 [facility] Office Phone Number [phone]  POST OP INSTRUCTIONS Take 400 mg of ibuprofen every 8 hours or 650 mg tylenol every 6 hours for next 72 hours then as needed. Use ice several times daily also.Next tylenol dose after 8pm  A prescription for pain medication may be given to you upon discharge.  Take your pain medication as prescribed, if needed.  If narcotic pain medicine is not needed, then you may take acetaminophen (Tylenol), naprosyn (Alleve) or ibuprofen (Advil) as needed. Take your usually prescribed medications unless otherwise directed If you need a refill on your pain medication, please contact your pharmacy.  They will contact our office to request authorization.  Prescriptions will not be filled after 5pm or on week-ends. You should eat very light the first 24 hours after surgery, such as soup, crackers, pudding, etc.  Resume your normal diet the day after surgery. Most patients will experience some swelling and bruising in the breast.  Ice packs and a good support bra will help.  Wear the breast binder provided or a sports bra for 72 hours day and night.  After that wear a sports bra during the day until you return to the office. Swelling and bruising can take several days to resolve.  It is common to experience some constipation if taking pain medication after surgery.  Increasing fluid intake and taking a stool softener will usually help or prevent this problem from occurring.  A mild laxative (Milk of Magnesia or Miralax) should be taken according to package directions if there are no bowel movements after 48 hours. I used skin glue on the incision, you may shower in 24 hours.  The glue will flake off over the next 2-3 weeks.  Any sutures or staples will be removed at the office during your follow-up visit. ACTIVITIES:  You may resume regular daily activities (gradually increasing) beginning the next day.  Wearing a good support bra or sports bra minimizes pain  and swelling.  You may have sexual intercourse when it is comfortable. You may drive when you no longer are taking prescription pain medication, you can comfortably wear a seatbelt, and you can safely maneuver your car and apply brakes. RETURN TO WORK:  ______________________________________________________________________________________ [name] should see your doctor in the office for a follow-up appointment approximately two weeks after your surgery.  Your doctor's nurse will typically make your follow-up appointment when she calls you with your pathology report.  Expect your pathology report 3-4 business days after your surgery.  You may call to check if you do not hear from [place] after three days. OTHER INSTRUCTIONS: _______________________________________________________________________________________________ _____________________________________________________________________________________________________________________________________ _____________________________________________________________________________________________________________________________________ _____________________________________________________________________________________________________________________________________  WHEN TO CALL DR WAKEFIELD: Fever over 101.0 Nausea and/or vomiting. Extreme swelling or bruising. Continued bleeding from incision. Increased pain, redness, or drainage from the incision.  The clinic staff is available to answer your questions during regular business hours.  Please don't hesitate to call and ask to speak to one of the nurses for clinical concerns.  If you have a medical emergency, go to the nearest emergency room or call 911.  A surgeon from [facility] is always on call at the hospital.  For further questions, please visit [email or web address]  Post Anesthesia Home Care Instructions  Activity: Get plenty of rest for the remainder of the day. A  responsible individual must stay with you for 24 hours following the procedure.  For the next 24 hours, DO NOT: -Drive a car -Advertising copywriter -Drink alcoholic beverages -Take any medication  unless instructed by your physician -Make any legal decisions or sign important papers.  Meals: Start with liquid foods such as gelatin or soup. Progress to regular foods as tolerated. Avoid greasy, spicy, heavy foods. If nausea and/or vomiting occur, drink only clear liquids until the nausea and/or vomiting subsides. Call your physician if vomiting continues.  Special Instructions/Symptoms: Your throat may feel dry or sore from the anesthesia or the breathing tube placed in your throat during surgery. If this causes discomfort, gargle with warm salt water. The discomfort should disappear within 24 hours.  If you had a scopolamine patch placed behind your ear for the management of post- operative nausea and/or vomiting:  1. The medication in the patch is effective for 72 hours, after which it should be removed.  Wrap patch in a tissue and discard in the trash. Wash hands thoroughly with soap and water. 2. You may remove the patch earlier than 72 hours if you experience unpleasant side effects which may include dry mouth, dizziness or visual disturbances. 3. Avoid touching the patch. Wash your hands with soap and water after contact with the patch.    Post Anesthesia Home Care Instructions  Activity: Get plenty of rest for the remainder of the day. A responsible individual must stay with you for 24 hours following the procedure.  For the next 24 hours, DO NOT: -Drive a car -Advertising copywriter -Drink alcoholic beverages -Take any medication unless instructed by your physician -Make any legal decisions or sign important papers.  Meals: Start with liquid foods such as gelatin or soup. Progress to regular foods as tolerated. Avoid greasy, spicy, heavy foods. If nausea and/or vomiting occur, drink only  clear liquids until the nausea and/or vomiting subsides. Call your physician if vomiting continues.  Special Instructions/Symptoms: Your throat may feel dry or sore from the anesthesia or the breathing tube placed in your throat during surgery. If this causes discomfort, gargle with warm salt water. The discomfort should disappear within 24 hours.  Next dose of Tylenol can be taken today at 630pm Next dose of Oxycodone (or pain medication) can be taken today at 930pm if needed.

## 2023-11-14 NOTE — Transfer of Care (Signed)
 Immediate Anesthesia Transfer of Care Note  Patient: [name]  Procedure(s) Performed: BREAST LUMPECTOMY WITH RADIOACTIVE SEED LOCALIZATION (Right: Breast)  Patient Location: PACU  Anesthesia Type:General  Level of Consciousness: awake and patient cooperative  Airway & Oxygen Therapy: Patient Spontanous Breathing and Patient connected to face mask oxygen  Post-op Assessment: Report given to RN and Post -op Vital signs reviewed and stable  Post vital signs: Reviewed and stable  Last Vitals:  Vitals Value Taken Time  BP 129/81 11/14/23 1448  Temp    Pulse 90 11/14/23 1453  Resp 13 11/14/23 1453  SpO2 100 % 11/14/23 1453  Vitals shown include unfiled device data.  Last Pain:  Vitals:   11/14/23 1210  TempSrc: Temporal  PainSc: 0-No pain         Complications: No notable events documented.

## 2023-11-14 NOTE — Anesthesia Procedure Notes (Signed)
 Procedure Name: LMA Insertion Date/Time: 11/14/2023 2:00 PM  Performed by: Yolanda Bonine, CRNAPre-anesthesia Checklist: Patient identified, Emergency Drugs available, Suction available, Patient being monitored and Timeout performed Patient Re-evaluated:Patient Re-evaluated prior to induction Oxygen Delivery Method: Circle system utilized Preoxygenation: Pre-oxygenation with 100% oxygen Induction Type: IV induction Ventilation: Mask ventilation without difficulty LMA: LMA inserted LMA Size: 4.0 Number of attempts: 1 Placement Confirmation: positive ETCO2 Dental Injury: Teeth and Oropharynx as per pre-operative assessment

## 2023-11-14 NOTE — Op Note (Signed)
 Preoperative diagnosis: Clinical stage I right breast cancer Postoperative diagnosis: Same as above Procedure: Right breast radioactive seed guided lumpectomy Surgeon: Dr. Harden Mo Anesthesia: General Estimated blood loss: Minimal Complications: None Drains: None Specimens: 1.  Right breast lumpectomy containing seed and clip 2.  Additional superior and inferior margins marked short superior, long lateral, double deep Sponge needle count was correct completion Disposition recovery stable   Indications: This is a 68 year old female underwent a mammogram after having a mass palpated by her gynecologist.  This had a 12 mm mass on mammography that measured 9 mm on ultrasound.  Axillary ultrasound was negative.  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  We discussed proceeding with lumpectomy alone.  Procedure: After informed consent was obtained she was taken to the operating room.  She was given antibiotics.  She had SCDs in place.  She was placed under general anesthesia without complication.  She was prepped and draped in the standard sterile surgical fashion.  A surgical timeout was then performed.  I located the seed in the upper outer quadrant.  It felt very close to the skin so I elected to make a curvilinear incision overlying it.  I infiltrated Marcaine throughout this area first.  I then dissected to the seed.  I used cautery to remove the seed in the surrounding tissue with an attempt to get a clear margin.  I then did a mammogram which confirmed removal of the seed and the clip.  Hemostasis was observed.  I did a 3D imaging I thought I was close to 2 margins.  I removed additional superior and inferior margins.  These were marked as above.  I then closed the cavity with 2-0 Vicryl.  The skin was closed with 3-0 Vicryl and 4-0 Monocryl.  Glue and Steri-Strips were applied.  She tolerated this well was transferred to  recovery stable.

## 2023-11-14 NOTE — Interval H&P Note (Signed)
 History and Physical Interval Note:  11/14/2023 1:10 PM  [name]  has presented today for surgery, with the diagnosis of RIGHT BREAST CANCER.  The various methods of treatment have been discussed with the patient and family. After consideration of risks, benefits and other options for treatment, the patient has consented to  Procedure(s) with comments: BREAST LUMPECTOMY WITH RADIOACTIVE SEED LOCALIZATION (Right) - LMA, Right lumpectomy as a surgical intervention.  The patient's history has been reviewed, patient examined, no change in status, stable for surgery.  I have reviewed the patient's chart and labs.  Questions were answered to the patient's satisfaction.     Emelia Loron

## 2023-11-15 ENCOUNTER — Encounter (HOSPITAL_BASED_OUTPATIENT_CLINIC_OR_DEPARTMENT_OTHER): Payer: Self-pay | Admitting: General Surgery

## 2023-11-15 DIAGNOSIS — C50411 Malignant neoplasm of upper-outer quadrant of right female breast: Secondary | ICD-10-CM | POA: Diagnosis not present

## 2023-11-16 ENCOUNTER — Encounter: Payer: Self-pay | Admitting: Radiation Oncology

## 2023-11-16 LAB — SURGICAL PATHOLOGY

## 2023-11-16 NOTE — Progress Notes (Signed)
 Location of Breast Cancer: Malignant Neoplasm of Upper-Outer Quadrant of Right Breast,    Histology per Pathology Report:    Receptor Status: ER(60%Positive), PR (95%Positive), Her2-neu (Negative), Ki-67(5%)  Did patient present with symptoms (if so, please note symptoms) or was this found on screening mammography?:  11/14/2023 [name], MD Mass was palpated by her gynecologist and also had a mammogram that showed a right breast asymmetry. Mammogram on 10/17/2023  Past/Anticipated interventions by surgeon, if any:  11/14/2023 Dr. [name] Breast Lumpectomy with Radioactive Seed Localization.  Past/Anticipated interventions by medical oncology, if any:  Radiation by Dr. Lurena Sally  Lymphedema issues, if any:  None    Pain issues, if any:   6 out 10 intermittent pain, naprosen for pain.  SAFETY ISSUES: Prior radiation? None Pacemaker/ICD? None Possible current pregnancy?None Is the patient on methotrexate? None  Current Complaints / other details:   None    BP (!) 167/90 (BP Location: Left Arm, Patient Position: Sitting, Cuff Size: Normal)   Pulse 100   Temp (!) 97.4 F (36.3 C)   Resp 18   Ht 5\' 1"  (1.549 m)   Wt 168 lb 6.4 oz (76.4 kg)   SpO2 97%   BMI 31.82 kg/m   Wt Readings from Last 3 Encounters:  11/19/23 168 lb 6.4 oz (76.4 kg)  11/14/23 164 lb 0.4 oz (74.4 kg)  11/07/23 166 lb 4.8 oz (75.4 kg)

## 2023-11-17 NOTE — Progress Notes (Signed)
 Radiation Oncology         [phone] ________________________________  Initial Outpatient Consultation  Name: [name] MRN: [id number]  Date: 11/19/2023  DOB: 04/16/1956  NW:GNFAOZ, Edwina Gram, MD  Enid Harry, MD   REFERRING PHYSICIAN: Enid Harry, MD  DIAGNOSIS:    ICD-10-CM   1. 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 female who presented with a right breast abnormality on the following imaging: bilateral screening mammogram on the date of 10/02/23.  No symptoms, if any, were reported at that time. Diagnostic right breast mammogram and right breast ultrasound on 10/17/23 further demonstrated a suspicious palpable mass in the 10 o'clock right breast measuring 0.9 cm in the greatest extent, located 5 cmfn. No evidence of right axillary lymphadenopathy was demonstrated.   Biopsy of the 10 o'clock right breast mass on date of 10/18/23 showed grade 2 invasive lobular carcinoma measuring 1.5 cm in the greatest linear extent of the sample, with MCIS; negative for LVI.  ER status: 60% positive with moderate-strong staining intensity; PR status 95% positive with strong staining intensity; Proliferation marker Ki67 at 5%; Her2 status negative;  Grade 2. No lymph nodes were examined.   She was accordingly referred to Dr. [name] and opted to proceed with a right breast lumpectomy without nodal biopsies on 11/14/23. Pathology from the procedure revealed: tumor the size of 3 cm; histology of grade 2 invasive lobular carcinoma with intermediate grade DCIS; all margins negative for invasive in situ carcinoma; margin status to invasive disease of less than 1 mm from the lateral margin; margin status to in situ disease of 4 mm from the posterior margin; no lymph nodes were examined. ER status: 60% positive with moderate-strong staining intensity; PR status 95% positive with strong staining intensity; Proliferation marker Ki67 at 5%; Her2 status negative; Grade 2  She has been referred to medical oncology and is scheduled to meet with Dr. Gudena later this week (on 11/21/23).  Of note: SLN evaluation was omitted per Dr. [name] based on SOUND and INSEMA guidelines   Pain issues, if any:   6 out 10 intermittent pain, naprosyn for pain.  SAFETY ISSUES: Prior radiation? None Pacemaker/ICD? None Possible current pregnancy?None Is the patient on methotrexate? None   PREVIOUS RADIATION THERAPY: No  PAST MEDICAL HISTORY:  has a past medical history of Asthma, Chronic constipation, Depression, History of echocardiogram, History of stress test, HLD (hyperlipidemia), Hypertension, and Pre-diabetes.    PAST SURGICAL HISTORY: Past Surgical History:  Procedure Laterality Date   ABDOMINAL HYSTERECTOMY     BREAST BIOPSY Right 10/17/2023   US  RT BREAST BX W LOC DEV 1ST LESION IMG BX SPEC US  GUIDE 10/17/2023 [facility]   BREAST BIOPSY  11/13/2023   MM RT RADIOACTIVE SEED  LOC MAMMO GUIDE 11/13/2023 [facility]   BREAST LUMPECTOMY WITH RADIOACTIVE SEED LOCALIZATION Right 11/14/2023   Procedure: BREAST LUMPECTOMY WITH RADIOACTIVE SEED LOCALIZATION;  Surgeon: Enid Harry, MD;  Location: [facility];  Service: General;   Laterality: Right;  LMA, Right lumpectomy   SHOULDER SURGERY Left    TONSILLECTOMY      FAMILY HISTORY: family history includes Heart disease (age of onset: 7) in her father.  SOCIAL HISTORY:  reports that she has quit smoking. She has never used smokeless tobacco. She reports current alcohol use. She reports that she does not use drugs.  ALLERGIES: Doxycycline  MEDICATIONS:  Current Outpatient Medications  Medication Sig Dispense Refill   albuterol (VENTOLIN HFA) 108 (90 Base) MCG/ACT inhaler Inhale 2 puffs by mouth every 6 hours as needed. (Patient taking differently: Inhale 2 puffs into the lungs every 6 (six) hours as needed for shortness of breath or wheezing.) 54 g 0   atorvastatin (LIPITOR) 40 MG tablet Take 1 tablet (40 mg total) by mouth daily. (Patient taking differently: Take 40 mg by mouth at bedtime.) 90 tablet 1   B Complex Vitamins (VITAMIN B COMPLEX) CAPS Take 1 capsule by mouth at bedtime.     budesonide-formoterol (SYMBICORT) 80-4.5 MCG/ACT inhaler Inhale 2 puffs every 6 hours as needed for shortness of breath (Patient taking differently: Inhale 2 puffs into the lungs every 6 (six) hours as needed (Shortness of breath).) 10.2 g 2   Cholecalciferol 50 MCG (2000 UT) CAPS Take 1 capsule by mouth daily at 12 noon.     Diclofenac Sodium (VOLTAREN PO) Take 1 tablet by mouth 2 (two) times daily as needed (pain).     DULoxetine (CYMBALTA) 60 MG capsule Take 1 capsule (60 mg total) by mouth daily. 90 capsule 1   estradiol (ESTRACE) 0.5 MG tablet Take 0.5 tablets (0.25 mg total) by mouth daily. (Patient not taking: Reported on 11/07/2023) 45 tablet 0   losartan (COZAAR) 25 MG tablet Take 1 tablet (25 mg total) by mouth daily. (Patient taking differently: Take 25 mg by mouth at bedtime.) 90 tablet 1   Multiple Vitamin (MULTI VITAMIN) TABS Take 1 tablet by mouth daily at 12 noon.     Omega 3 1000 MG CAPS Take 1 capsule by mouth daily at 12 noon.     omeprazole (PRILOSEC) 20 MG capsule  Take 20 mg by mouth daily as needed (Indigestion).     Turmeric 400 MG CAPS Take 400 mg by mouth daily at 12 noon.     Zinc 20 MG CAPS Take 20 mg by mouth daily at 12 noon.     No current facility-administered medications for this encounter.    REVIEW OF SYSTEMS: As above in HPI.   PHYSICAL EXAM:  height is 5\' 1"  (1.549 m) and weight is 168 lb 6.4 oz (76.4 kg). Her temperature is 97.4 F (36.3 C) (abnormal). Her blood pressure is 167/90 (abnormal) and her pulse is 100. Her respiration is 18 and oxygen saturation is 97%.   General: Alert and oriented, in no acute distress HEENT: Head is normocephalic. Extraocular movements are intact.  Skin: R breast bruising, diffuse Musculoskeletal: symmetric strength and muscle tone throughout. Good ROM in shoulders Neurologic: Cranial nerves II through XII are grossly intact. No obvious focalities. Speech is fluent. Coordination is intact. Psychiatric: Judgment and insight are intact. Affect is appropriate. Breasts: diffuse bruising in R breast with swelling at lumpectomy site .    ECOG = 0  0 -  Asymptomatic (Fully active, able to carry on all predisease activities without restriction)  1 - Symptomatic but completely ambulatory (Restricted in physically strenuous activity but ambulatory and able to carry out work of a light or sedentary nature. For example, light housework, office work)  2 - Symptomatic, <50% in bed during the day (Ambulatory and capable of all self care but unable to carry out any work activities. Up and about more than 50% of waking hours)  3 - Symptomatic, >50% in bed, but not bedbound (Capable of only limited self-care, confined to bed or chair 50% or more of waking hours)  4 - Bedbound (Completely disabled. Cannot carry on any self-care. Totally confined to bed or chair)  5 - Death   Santiago Glad MM, Creech RH, Tormey DC, et al. [phone]). "Toxicity and response criteria of the [facility]". Am. Evlyn Clines. Oncol. 5  (6): 649-55   LABORATORY DATA:   CBC No results found for: "WBC", "RBC", "HGB", "HCT", "PLT", "MCV", "MCH", "MCHC", "RDW", "LYMPHSABS", "MONOABS", "EOSABS", "BASOSABS"  CMP     Component Value Date/Time   PROT 7.3 12/12/2006 0846   ALBUMIN 4.0 12/12/2006 0846   AST 25 03/16/2011 0806   ALT 22 03/04/2008 0754   ALKPHOS 115 12/12/2006 0846   BILITOT 0.8 12/12/2006 0846      RADIOGRAPHY: MM Breast Surgical Specimen Result Date: 11/14/2023 CLINICAL DATA:  68 year old with biopsy-proven invasive lobular carcinoma involving the RIGHT breast. Radioactive seed localization was performed yesterday in anticipation of today's lumpectomy. EXAM: SPECIMEN RADIOGRAPH OF THE RIGHT BREAST COMPARISON:  Previous exam(s). FINDINGS: Status post excision of the RIGHT breast. The radioactive seed and the ribbon shaped tissue marking clip are present within the non-compressed specimen. The seed is intact. This was discussed by telephone with the operating room nurse at the time of interpretation on 11/14/2023 at 2:30 p.m. IMPRESSION: Specimen radiograph of the RIGHT breast. Electronically Signed   By: Hulan Saas M.D.   On: 11/14/2023 14:36   MM RT RADIOACTIVE SEED LOC MAMMO GUIDE Result Date: 11/13/2023 CLINICAL DATA:  68 year old female presenting for seed localization of the right breast. Patient has newly diagnosed right breast invasive mammary carcinoma. EXAM: MAMMOGRAPHIC GUIDED RADIOACTIVE SEED LOCALIZATION OF THE right BREAST COMPARISON:  Previous exam(s). FINDINGS: Patient presents for radioactive seed localization prior to right breast lumpectomy. I met with the patient and we discussed the procedure of seed localization including benefits and alternatives. We discussed the high likelihood of a successful procedure. We discussed the risks of the procedure including infection, bleeding, tissue injury and further surgery. We discussed the low dose of radioactivity involved in the procedure. Informed,  written consent was given. The usual time-out protocol was performed immediately prior to the procedure. Using mammographic guidance, sterile technique, 1% lidocaine and an I-125 radioactive seed, the ribbon biopsy marking clip in the right breast was localized using a lateral approach. The follow-up mammogram images confirm the seed in the expected location and were marked for Dr. Dwain Sarna. Follow-up survey of the patient confirms presence of the radioactive seed. Order number of I-125 seed:  [id number]. Total activity: 0.271 mCi  reference Date: 09/27/2023 The patient tolerated the procedure well and was [facility]. She was given instructions regarding seed removal. IMPRESSION: Radioactive seed localization right breast. No apparent complications. Electronically Signed   By: Emmaline Kluver M.D.   On: 11/13/2023 11:49      IMPRESSION/PLAN:  Cancer Staging  Malignant neoplasm of upper-outer quadrant of right breast  in female, estrogen receptor positive (HCC) Staging form: Breast, AJCC 8th Edition - Clinical: Stage IA (cT1b, cN0, cM0, G2, ER+, PR+, HER2-) - Signed by Cameron Cea, MD on 11/07/2023 Stage prefix: Initial diagnosis Histologic grading system: 3 grade system  pT2 pNX  It was a pleasure meeting the patient today. We discussed the risks, benefits, and side effects of radiotherapy. I recommend radiotherapy to the right breast and adjacent axilla with high tangents  to reduce her risk of locoregional recurrence by 2/3.  This would be given with standard hypofractionation.  She has not yet seen med/onc. If chemotherapy were to be given, this would precede radiotherapy.  I will hold radiation planning until she is "cleared" by Dr Lee Public.  I reviewed the logistics, benefits, risks, and potential side effects of this treatment in detail. Risks may include but not necessary be limited to acute and late injury tissue in the radiation fields such as skin irritation (change in  color/pigmentation, itching, dryness, pain, peeling). She may experience fatigue. We also discussed possible risk of long term cosmetic changes or scar tissue. There is also a smaller risk for lung toxicity, cardiac toxicity, brachial plexopathy, lymphedema, musculoskeletal changes, rib fragility or induction of a second malignancy, late chronic non-healing soft tissue wound.    The patient asked good questions which I answered to her satisfaction. She is enthusiastic about proceeding with treatment. A consent form has been  signed and placed in her chart. I look forward to participating in the patient's care.  I will await her referral back to me for eventual CT simulation/treatment planning.  On date of service, in total, I spent 30 minutes on this encounter. Patient was seen in person.   __________________________________________   Colie Dawes, MD  This document serves as a record of services personally performed by Colie Dawes, MD. It was created on her behalf by Aleta Anda, a trained medical scribe. The creation of this record is based on the scribe's personal observations and the provider's statements to them. This document has been checked and approved by the attending provider.

## 2023-11-19 ENCOUNTER — Ambulatory Visit
Admission: RE | Admit: 2023-11-19 | Discharge: 2023-11-19 | Disposition: A | Discharge status: Home / Self Care | Source: Ambulatory Visit | Attending: Radiation Oncology | Admitting: Radiation Oncology

## 2023-11-19 ENCOUNTER — Other Ambulatory Visit: Payer: Self-pay

## 2023-11-19 ENCOUNTER — Encounter: Payer: Self-pay | Admitting: Radiation Oncology

## 2023-11-19 VITALS — BP 167/90 | HR 100 | Temp 97.4°F | Resp 18 | Ht 61.0 in | Wt 168.4 lb

## 2023-11-19 DIAGNOSIS — Z87891 Personal history of nicotine dependence: Secondary | ICD-10-CM | POA: Diagnosis not present

## 2023-11-19 DIAGNOSIS — Z170 Estrogen receptor positive status [ER+]: Secondary | ICD-10-CM | POA: Insufficient documentation

## 2023-11-19 DIAGNOSIS — Z79899 Other long term (current) drug therapy: Secondary | ICD-10-CM | POA: Diagnosis not present

## 2023-11-19 DIAGNOSIS — J45909 Unspecified asthma, uncomplicated: Secondary | ICD-10-CM | POA: Insufficient documentation

## 2023-11-19 DIAGNOSIS — I10 Essential (primary) hypertension: Secondary | ICD-10-CM | POA: Insufficient documentation

## 2023-11-19 DIAGNOSIS — E785 Hyperlipidemia, unspecified: Secondary | ICD-10-CM | POA: Insufficient documentation

## 2023-11-19 DIAGNOSIS — C50411 Malignant neoplasm of upper-outer quadrant of right female breast: Secondary | ICD-10-CM | POA: Diagnosis not present

## 2023-11-19 DIAGNOSIS — Z7951 Long term (current) use of inhaled steroids: Secondary | ICD-10-CM | POA: Diagnosis not present

## 2023-11-20 ENCOUNTER — Other Ambulatory Visit: Payer: Self-pay

## 2023-11-20 ENCOUNTER — Other Ambulatory Visit (HOSPITAL_COMMUNITY): Payer: Self-pay

## 2023-11-21 ENCOUNTER — Inpatient Hospital Stay: Attending: Hematology and Oncology | Admitting: Hematology and Oncology

## 2023-11-21 VITALS — BP 145/83 | HR 100 | Temp 97.7°F | Resp 18 | Ht 61.0 in | Wt 167.0 lb

## 2023-11-21 DIAGNOSIS — Z1732 Human epidermal growth factor receptor 2 negative status: Secondary | ICD-10-CM | POA: Insufficient documentation

## 2023-11-21 DIAGNOSIS — C50411 Malignant neoplasm of upper-outer quadrant of right female breast: Secondary | ICD-10-CM | POA: Insufficient documentation

## 2023-11-21 DIAGNOSIS — Z170 Estrogen receptor positive status [ER+]: Secondary | ICD-10-CM | POA: Diagnosis not present

## 2023-11-21 NOTE — Assessment & Plan Note (Signed)
 10/17/2023: Screening mammogram detected right breast asymmetry and new palpable area of concern UOQ right breast, ultrasound irregular hypoechoic mass 10 o'clock position 5 cm from the nipple 0.9 cm, axilla negative, biopsy: Grade 2 invasive lobular cancer with LCIS ER 60%, PR 95%, Ki67 5%, HER2 0 negative  11/14/2023: Right lumpectomy: Grade 2 IDC 3 cm with intermediate grade DCIS, margins negative, LVI not identified, right superior margin excision: Microscopic focus of ILC 0.1 cm, inferior margin excision: Microscopic focus of ILC 0.6 cm, ER 60%, PR 95%, HER2 0, Ki-67 5%  Pathology counseling: I discussed the final pathology report of the patient provided  a copy of this report. I discussed the margins as well as lymph node surgeries. We also discussed the final staging along with previously performed ER/PR and HER-2/neu testing.   Recommendations: 1. Oncotype DX testing to determine if chemotherapy would be of any benefit followed by 2. Adjuvant radiation therapy followed by 3. Adjuvant antiestrogen therapy  Oncotype counseling: I discussed Oncotype DX test. I explained to the patient that this is a 21 gene panel to evaluate patient tumors DNA to calculate recurrence score. This would help determine whether patient has high risk or low risk breast cancer. She understands that if her tumor was found to be high risk, she would benefit from systemic chemotherapy. If low risk, no need of chemotherapy.  Return to clinic based on Oncotype DX test result

## 2023-11-21 NOTE — Progress Notes (Signed)
 Patient Care Team: Sigmund Hazel, MD as PCP - General (Family Medicine)  DIAGNOSIS:  Encounter Diagnosis  Name Primary?   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%, HER2 0 negative   11/07/2023 Cancer Staging   Staging form: Breast, AJCC 8th Edition - Clinical: Stage IA (cT1b, cN0, cM0, G2, ER+, PR+, HER2-) - Signed by Serena Croissant, MD on 11/07/2023 Stage prefix: Initial diagnosis Histologic grading system: 3 grade system   11/14/2023 Surgery   Right lumpectomy: Grade 2 IDC 3 cm with intermediate grade DCIS, margins negative, LVI not identified, right superior margin excision: Microscopic focus of ILC 0.1 cm, inferior margin excision: Microscopic focus of ILC 0.6 cm, ER 60%, PR 95%, HER2 0, Ki-67 5%   11/21/2023 Cancer Staging   Staging form: Breast, AJCC 8th Edition - Pathologic: Stage IA (pT2, pN0, cM0, G2, ER+, PR+, HER2-) - Signed by Serena Croissant, MD on 11/21/2023 Stage prefix: Initial diagnosis Histologic grading system: 3 grade system     CHIEF COMPLIANT: Newly diagnosed breast cancer  HISTORY OF PRESENT ILLNESS:  History of Present Illness [name], 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 The diagnosis was made following her annual mammogram, which was prompted by her doctor's detection of a lump in her breast. She underwent surgery, which revealed a 3 cm area of involvement with additional  areas of 0.6 cm and 0.1 cm. The final margins were clear, indicating that all identifiable pathological tissue was removed. She has no family history of breast cancer and this is her first encounter with any breast-related issues.  The patient's cancer is estrogen receptor and progesterone receptor positive, and HER2 negative, which is considered a favorable prognosis. The KI-67 proliferation marker was at 5%, indicating a low rate of cell multiplication. Despite the size of the tumor, the patient's cancer is staged as 1A due to the favorable receptors.  The patient was previously on a low dose of estradiol for menopausal symptoms, mainly hot flashes, but has discontinued it since the diagnosis. She has been managing the symptoms well since discontinuation.     ALLERGIES:  is allergic to doxycycline.  MEDICATIONS:  Current Outpatient Medications  Medication Sig Dispense Refill   albuterol (VENTOLIN HFA) 108 (90 Base) MCG/ACT inhaler Inhale 2 puffs by mouth every 6 hours as needed. (Patient taking differently: Inhale 2 puffs into the lungs every 6 (six) hours as needed for shortness of breath or wheezing.) 54 g 0   atorvastatin (LIPITOR) 40 MG tablet Take 1 tablet (40 mg total) by mouth daily. (Patient taking differently: Take 40 mg by mouth at bedtime.) 90 tablet 1   B Complex Vitamins (VITAMIN B COMPLEX) CAPS Take 1 capsule by mouth at bedtime.     budesonide-formoterol (SYMBICORT) 80-4.5 MCG/ACT inhaler Inhale 2 puffs every 6 hours as needed for shortness of breath (Patient taking differently: Inhale 2 puffs into the lungs every 6 (six) hours as needed (Shortness of breath).) 10.2 g 2   Cholecalciferol  50 MCG (2000 UT) CAPS Take 1 capsule by mouth daily at 12 noon.     Diclofenac Sodium (VOLTAREN PO) Take 1 tablet by mouth 2 (two) times daily as needed (pain).     DULoxetine (CYMBALTA) 60 MG capsule Take 1 capsule (60 mg total) by mouth daily. 90 capsule 1   losartan (COZAAR) 25 MG tablet Take  1 tablet (25 mg total) by mouth daily. (Patient taking differently: Take 25 mg by mouth at bedtime.) 90 tablet 1   Multiple Vitamin (MULTI VITAMIN) TABS Take 1 tablet by mouth daily at 12 noon.     Omega 3 1000 MG CAPS Take 1 capsule by mouth daily at 12 noon.     Turmeric 400 MG CAPS Take 400 mg by mouth daily at 12 noon.     Zinc 20 MG CAPS Take 20 mg by mouth daily at 12 noon.     omeprazole (PRILOSEC) 20 MG capsule Take 20 mg by mouth daily as needed (Indigestion). (Patient not taking: Reported on 11/21/2023)     No current facility-administered medications for this visit.    PHYSICAL EXAMINATION: ECOG PERFORMANCE STATUS: 1 - Symptomatic but completely ambulatory  Vitals:   11/21/23 1158  BP: (!) 145/83  Pulse: 100  Resp: 18  Temp: 97.7 F (36.5 C)  SpO2: 96%   Filed Weights   11/21/23 1158  Weight: 167 lb (75.8 kg)    LABORATORY DATA:  I have reviewed the data as listed    Latest Ref Rng & Units 03/16/2011    8:06 AM 07/13/2010    9:21 AM 03/15/2010   12:00 AM  CMP  AST 0 - 37 U/L 25  34  36     No results found for: "WBC", "HGB", "HCT", "MCV", "PLT", "NEUTROABS"  ASSESSMENT & PLAN:  Malignant neoplasm of upper-outer quadrant of right breast in female, estrogen receptor positive (HCC) 10/17/2023: Screening mammogram detected right breast asymmetry and new palpable area of concern UOQ right breast, ultrasound irregular hypoechoic mass 10 o'clock position 5 cm from the nipple 0.9 cm, axilla negative, biopsy: 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 I discussed  the margins as well as lymph node surgeries. We also discussed the final staging along with previously performed ER/PR and HER-2/neu testing.   Recommendations: 1. Oncotype DX testing to determine if chemotherapy would be of any benefit followed by 2. Adjuvant radiation therapy followed by 3. Adjuvant antiestrogen therapy  Oncotype counseling: I discussed Oncotype DX test. I explained to the patient that this is a 21 gene panel to evaluate patient tumors DNA to calculate recurrence score. This would help determine whether patient has high risk or low risk breast cancer. She understands that if her tumor was found to be high risk, she would benefit from systemic chemotherapy. If low risk, no need of chemotherapy.  Return to clinic based on Oncotype DX test result   No orders of the defined types were placed in this encounter.  The patient has a good understanding of the overall plan. she agrees with it. she will call  with any problems that may develop before the next visit here. Total time spent: 30 mins including face to face time and time spent for planning, charting and co-ordination of care   Margert Sheerer, MD 11/21/23

## 2023-11-22 ENCOUNTER — Encounter: Payer: Self-pay | Admitting: *Deleted

## 2023-11-23 ENCOUNTER — Other Ambulatory Visit (HOSPITAL_COMMUNITY): Payer: Self-pay

## 2023-11-26 ENCOUNTER — Other Ambulatory Visit: Payer: Self-pay

## 2023-11-26 ENCOUNTER — Other Ambulatory Visit (HOSPITAL_COMMUNITY): Payer: Self-pay

## 2023-11-26 MED ORDER — LOSARTAN POTASSIUM 25 MG PO TABS
25.0000 mg | ORAL_TABLET | Freq: Every day | ORAL | 1 refills | Status: DC
  Filled 2023-11-26: qty 90, 90d supply, fill #0
  Filled 2024-02-20: qty 90, 90d supply, fill #1

## 2023-11-27 ENCOUNTER — Telehealth: Payer: Self-pay | Admitting: Licensed Clinical Social Worker

## 2023-11-27 NOTE — Telephone Encounter (Signed)
 [facility] Clinical Social Work  Clinical Social Work was referred by new patient protocol for assessment of psychosocial needs.  Clinical Social Worker attempted to contact patient by phone to offer support and assess for needs.   Patient's significant other, Porfirio Bristol, answered. Pt was unavailable.  CSW provided brief overview of support services. Porfirio Bristol denied any SDOH needs and agreed to inform [place] of coping support services.  Direct contact information provided.     Klyn Kroening E Avalynn Bowe, LCSW  Clinical Social Worker [facility]

## 2023-11-29 DIAGNOSIS — Z170 Estrogen receptor positive status [ER+]: Secondary | ICD-10-CM | POA: Diagnosis not present

## 2023-11-29 DIAGNOSIS — C50411 Malignant neoplasm of upper-outer quadrant of right female breast: Secondary | ICD-10-CM | POA: Diagnosis not present

## 2023-11-30 ENCOUNTER — Telehealth: Payer: Self-pay | Admitting: *Deleted

## 2023-11-30 ENCOUNTER — Encounter (HOSPITAL_COMMUNITY): Payer: Self-pay

## 2023-11-30 ENCOUNTER — Encounter: Payer: Self-pay | Admitting: *Deleted

## 2023-11-30 DIAGNOSIS — Z170 Estrogen receptor positive status [ER+]: Secondary | ICD-10-CM

## 2023-11-30 NOTE — Telephone Encounter (Signed)
 Received oncotype results 11/3%. Referral placed for Dr. Lurena Sally

## 2023-12-04 NOTE — Progress Notes (Signed)
 Location of Breast Cancer: Malignant Neoplasm of Upper-Outer Quadrant of Right Breast, Estrogen Receptor Positive  Histology per Pathology Report:    Receptor Status: ER(60% Positive), PR (95% Positive), Her2-neu (Negative), Ki-67(5%)  Did patient present with symptoms (if so, please note symptoms) or was this found on screening mammography?:  10/17/2023 Mammogram   Past/Anticipated interventions by surgeon, if any: 11/14/2023 [name], MD Breast Lumpectomy With Radioactive Seed Localization  Past/Anticipated interventions by medical oncology, if any:  11/21/2023 Lee Public, MD    Lymphedema issues, if any:    None   Pain issues, if any: Pain is a one out of 10, says surgical incisions are healing well.  SAFETY ISSUES: Prior radiation? None Pacemaker/ICD? None Possible current pregnancy?None Is the patient on methotrexate? None  Current Complaints / other details:   None

## 2023-12-06 ENCOUNTER — Encounter: Payer: Self-pay | Admitting: *Deleted

## 2023-12-11 ENCOUNTER — Encounter: Payer: Self-pay | Admitting: Radiation Oncology

## 2023-12-11 NOTE — Progress Notes (Signed)
 Radiation Oncology         [phone] ________________________________  Name: [name] MRN: [id number]  Date: 12/12/2023  DOB: Apr 23, 1956  Follow-Up Visit Note  Outpatient  CC: Perley Bradley, MD  Cameron Cea, MD  Diagnosis:      ICD-10-CM   1. Malignant neoplasm of upper-outer quadrant of right breast in female, estrogen receptor positive (HCC)  C50.411    Z17.0      ***  CHIEF COMPLAINT: Here to discuss management of right breast cancer  Narrative:  The patient returns today for follow-up. She was last seen in office on 11/19/23 for her initial consultation.     She opted to proceed with right breast lumpectomy with radioactive seed localization on 11/14/23 under the care of Dr. [name]. Breast/nodal pathology revealed: tumor the size of 3 cm; histology of grade 2 invasive lobular carcinoma with intermediate grade DCIS; all margins negative for invasive in situ carcinoma; margin status to invasive disease of less than 1 mm from the lateral margin; margin status to in situ disease of 4 mm from the posterior margin; no lymph nodes were examined. ER status: 60% positive with moderate-strong staining intensity; PR status 95% positive with strong staining intensity; Proliferation marker Ki67 at 5%; Her2 status negative; Grade 2   Oncotype DX was obtained on the final surgical sample and the recurrence score of 11 predicts a risk of recurrence outside the breast over the next 9 years of 3%, if the patient's only systemic therapy is an antiestrogen for 5 years.  It also predicts no significant benefit from chemotherapy.  Patient presented for a post-op follow up with PA Maczis on 11/21/23 with complains of large soft hematoma of the right breast with ecchymosis across the breast. At that time, she was advised to apply icy and heat as needed and to continue to monitoring the region. She then returned the following day for an attempt at aspiration of the hematom but only 2-3cc hematoma  fluid was aspirated as the fluid was too thick to evacuate. She returned on the 29th to aspirate more fluid from the hematoma. Most recent follow up with Dr. [name] on 12/11/23, during which her right breast incision was healing without infection and her hematoma and ecchymosis continues to be present but this is significantly softer and smaller from prior.      Symptomatically, the patient reports: ***        ALLERGIES:  is allergic to doxycycline.  Meds: Current Outpatient Medications  Medication Sig Dispense Refill   atorvastatin  (LIPITOR) 40 MG tablet Take 1 tablet (40 mg total) by mouth daily. (Patient taking differently: Take 40 mg by mouth at bedtime.) 90 tablet 1   B Complex Vitamins (VITAMIN B COMPLEX) CAPS Take 1 capsule by mouth at bedtime.     budesonide -formoterol  (SYMBICORT ) 80-4.5 MCG/ACT inhaler Inhale 2 puffs every 6 hours as needed for shortness of breath (Patient taking differently: Inhale 2 puffs into the lungs every 6 (six) hours as needed (Shortness of breath).) 10.2 g 2   Cholecalciferol 50 MCG (2000 UT) CAPS Take 1 capsule by mouth daily at 12 noon.     DULoxetine  (CYMBALTA ) 60 MG capsule Take 1 capsule (60 mg total) by mouth daily. 90 capsule 1   losartan  (COZAAR ) 25 MG tablet Take 1 tablet (25 mg total) by mouth daily. 90 tablet 1   Multiple Vitamin (MULTI VITAMIN) TABS Take 1 tablet by mouth daily at 12 noon.     Omega 3 1000  MG CAPS Take 1 capsule by mouth daily at 12 noon.     omeprazole (PRILOSEC) 20 MG capsule Take 20 mg by mouth daily as needed (Indigestion).     Turmeric 400 MG CAPS Take 400 mg by mouth daily at 12 noon.     Zinc 20 MG CAPS Take 20 mg by mouth daily at 12 noon.     albuterol  (VENTOLIN  HFA) 108 (90 Base) MCG/ACT inhaler Inhale 2 puffs by mouth every 6 hours as needed. (Patient not taking: Reported on 12/11/2023) 54 g 0   Diclofenac Sodium (VOLTAREN PO) Take 1 tablet by mouth 2 (two) times daily as needed (pain).     No current  facility-administered medications for this encounter.    Physical Findings:  vitals were not taken for this visit. .     General: Alert and oriented, in no acute distress HEENT: Head is normocephalic. Extraocular movements are intact. Oropharynx is clear. Neck: Neck is supple, no palpable cervical or supraclavicular lymphadenopathy. Heart: Regular in rate and rhythm with no murmurs, rubs, or gallops. Chest: Clear to auscultation bilaterally, with no rhonchi, wheezes, or rales. Abdomen: Soft, nontender, nondistended, with no rigidity or guarding. Extremities: No cyanosis or edema. Lymphatics: see Neck Exam Musculoskeletal: symmetric strength and muscle tone throughout. Neurologic: No obvious focalities. Speech is fluent.  Psychiatric: Judgment and insight are intact. Affect is appropriate. Breast exam reveals ***  Lab Findings: No results found for: "WBC", "HGB", "HCT", "MCV", "PLT"  @LASTCHEMISTRY @  Radiographic Findings: MM Breast Surgical Specimen Result Date: 11/14/2023 CLINICAL DATA:  68 year old with biopsy-proven invasive lobular carcinoma involving the RIGHT breast. Radioactive seed localization was performed yesterday in anticipation of today's lumpectomy. EXAM: SPECIMEN RADIOGRAPH OF THE RIGHT BREAST COMPARISON:  Previous exam(s). FINDINGS: Status post excision of the RIGHT breast. The radioactive seed and the ribbon shaped tissue marking clip are present within the non-compressed specimen. The seed is intact. This was discussed by telephone with the operating room nurse at the time of interpretation on 11/14/2023 at 2:30 p.m. IMPRESSION: Specimen radiograph of the RIGHT breast. Electronically Signed   By: Rinda Cheers M.D.   On: 11/14/2023 14:36   MM RT RADIOACTIVE SEED LOC MAMMO GUIDE Result Date: 11/13/2023 CLINICAL DATA:  68 year old female presenting for seed localization of the right breast. Patient has newly diagnosed right breast invasive mammary carcinoma. EXAM:  MAMMOGRAPHIC GUIDED RADIOACTIVE SEED LOCALIZATION OF THE right BREAST COMPARISON:  Previous exam(s). FINDINGS: Patient presents for radioactive seed localization prior to right breast lumpectomy. I met with the patient and we discussed the procedure of seed localization including benefits and alternatives. We discussed the high likelihood of a successful procedure. We discussed the risks of the procedure including infection, bleeding, tissue injury and further surgery. We discussed the low dose of radioactivity involved in the procedure. Informed, written consent was given. The usual time-out protocol was performed immediately prior to the procedure. Using mammographic guidance, sterile technique, 1% lidocaine  and an I-125 radioactive seed, the ribbon biopsy marking clip in the right breast was localized using a lateral approach. The follow-up mammogram images confirm the seed in the expected location and were marked for Dr. [name]. Follow-up survey of the patient confirms presence of the radioactive seed. Order number of I-125 seed:  [id number]. Total activity: 0.271 mCi  reference Date: 09/27/2023 The patient tolerated the procedure well and was [facility]. She was given instructions regarding seed removal. IMPRESSION: Radioactive seed localization right breast. No apparent complications. Electronically Signed  By: Allena Ito M.D.   On: 11/13/2023 11:49    Impression/Plan: We discussed adjuvant radiotherapy today.  I recommend *** in order to ***.  I reviewed the logistics, benefits, risks, and potential side effects of this treatment in detail. Risks may include but not necessary be limited to acute and late injury tissue in the radiation fields such as skin irritation (change in color/pigmentation, itching, dryness, pain, peeling). She may experience fatigue. We also discussed possible risk of long term cosmetic changes or scar tissue. There is also a smaller risk for lung  toxicity, ***cardiac toxicity, ***brachial plexopathy, ***lymphedema, ***musculoskeletal changes, ***rib fragility or ***induction of a second malignancy, ***late chronic non-healing soft tissue wound.    The patient asked good questions which I answered to her satisfaction. She is enthusiastic about proceeding with treatment. A consent form has been *** signed and placed in her chart.  A total of *** medically necessary complex treatment devices will be fabricated and supervised by me: *** fields with MLCs for custom blocks to protect heart, and lungs;  and, a Vac-lok. MORE COMPLEX DEVICES MAY BE MADE IN DOSIMETRY FOR FIELD IN FIELD BEAMS FOR DOSE HOMOGENEITY.  I have requested : 3D Simulation which is medically necessary to give adequate dose to at risk tissues while sparing lungs and heart.  I have requested a DVH of the following structures: lungs, heart, *** lumpectomy cavity.    The patient will receive *** Gy in *** fractions to the *** with *** fields.  This will be *** followed by a boost.  On date of service, in total, I spent *** minutes on this encounter. Patient was seen in person.  _____________________________________   Colie Dawes, MD  This document serves as a record of services personally performed by Colie Dawes, MD. It was created on her behalf by Lucky Sable, a trained medical scribe. The creation of this record is based on the scribe's personal observations and the provider's statements to them. This document has been checked and approved by the attending provider.

## 2023-12-12 ENCOUNTER — Ambulatory Visit
Admission: RE | Admit: 2023-12-12 | Discharge: 2023-12-12 | Disposition: A | Discharge status: Home / Self Care | Source: Ambulatory Visit | Attending: Radiation Oncology | Admitting: Radiation Oncology

## 2023-12-12 ENCOUNTER — Encounter: Payer: Self-pay | Admitting: Radiation Oncology

## 2023-12-12 VITALS — BP 152/78 | HR 107 | Temp 97.1°F | Resp 18 | Ht 61.25 in | Wt 163.2 lb

## 2023-12-12 DIAGNOSIS — Z7951 Long term (current) use of inhaled steroids: Secondary | ICD-10-CM | POA: Diagnosis not present

## 2023-12-12 DIAGNOSIS — Z170 Estrogen receptor positive status [ER+]: Secondary | ICD-10-CM | POA: Diagnosis not present

## 2023-12-12 DIAGNOSIS — C50411 Malignant neoplasm of upper-outer quadrant of right female breast: Secondary | ICD-10-CM | POA: Diagnosis not present

## 2023-12-12 DIAGNOSIS — Z510 Encounter for antineoplastic radiation therapy: Secondary | ICD-10-CM | POA: Insufficient documentation

## 2023-12-12 DIAGNOSIS — Z79899 Other long term (current) drug therapy: Secondary | ICD-10-CM | POA: Insufficient documentation

## 2023-12-14 DIAGNOSIS — Z170 Estrogen receptor positive status [ER+]: Secondary | ICD-10-CM | POA: Diagnosis not present

## 2023-12-14 DIAGNOSIS — C50411 Malignant neoplasm of upper-outer quadrant of right female breast: Secondary | ICD-10-CM | POA: Diagnosis not present

## 2023-12-14 DIAGNOSIS — Z510 Encounter for antineoplastic radiation therapy: Secondary | ICD-10-CM | POA: Diagnosis not present

## 2023-12-17 ENCOUNTER — Encounter: Payer: Self-pay | Admitting: *Deleted

## 2023-12-17 DIAGNOSIS — Z170 Estrogen receptor positive status [ER+]: Secondary | ICD-10-CM

## 2023-12-18 ENCOUNTER — Encounter: Payer: Self-pay | Admitting: *Deleted

## 2023-12-24 ENCOUNTER — Ambulatory Visit
Admission: RE | Admit: 2023-12-24 | Discharge: 2023-12-24 | Disposition: A | Discharge status: Home / Self Care | Source: Ambulatory Visit | Attending: Radiation Oncology | Admitting: Radiation Oncology

## 2023-12-24 ENCOUNTER — Other Ambulatory Visit: Payer: Self-pay

## 2023-12-24 DIAGNOSIS — Z170 Estrogen receptor positive status [ER+]: Secondary | ICD-10-CM | POA: Diagnosis not present

## 2023-12-24 DIAGNOSIS — C50411 Malignant neoplasm of upper-outer quadrant of right female breast: Secondary | ICD-10-CM | POA: Diagnosis not present

## 2023-12-24 DIAGNOSIS — Z510 Encounter for antineoplastic radiation therapy: Secondary | ICD-10-CM | POA: Diagnosis not present

## 2023-12-24 LAB — RAD ONC ARIA SESSION SUMMARY
Course Elapsed Days: 0
Plan Fractions Treated to Date: 1
Plan Prescribed Dose Per Fraction: 2.67 Gy
Plan Total Fractions Prescribed: 15
Plan Total Prescribed Dose: 40.05 Gy
Reference Point Dosage Given to Date: 2.67 Gy
Reference Point Session Dosage Given: 2.67 Gy
Session Number: 1

## 2023-12-25 ENCOUNTER — Ambulatory Visit
Admission: RE | Admit: 2023-12-25 | Discharge: 2023-12-25 | Disposition: A | Discharge status: Home / Self Care | Source: Ambulatory Visit | Attending: Radiation Oncology | Admitting: Radiation Oncology

## 2023-12-25 ENCOUNTER — Other Ambulatory Visit: Payer: Self-pay

## 2023-12-25 DIAGNOSIS — Z510 Encounter for antineoplastic radiation therapy: Secondary | ICD-10-CM | POA: Diagnosis not present

## 2023-12-25 DIAGNOSIS — C50411 Malignant neoplasm of upper-outer quadrant of right female breast: Secondary | ICD-10-CM | POA: Diagnosis not present

## 2023-12-25 DIAGNOSIS — Z170 Estrogen receptor positive status [ER+]: Secondary | ICD-10-CM | POA: Diagnosis not present

## 2023-12-25 LAB — RAD ONC ARIA SESSION SUMMARY
Course Elapsed Days: 1
Plan Fractions Treated to Date: 2
Plan Prescribed Dose Per Fraction: 2.67 Gy
Plan Total Fractions Prescribed: 15
Plan Total Prescribed Dose: 40.05 Gy
Reference Point Dosage Given to Date: 5.34 Gy
Reference Point Session Dosage Given: 2.67 Gy
Session Number: 2

## 2023-12-26 ENCOUNTER — Ambulatory Visit
Admission: RE | Admit: 2023-12-26 | Discharge: 2023-12-26 | Disposition: A | Discharge status: Home / Self Care | Source: Ambulatory Visit | Attending: Radiation Oncology | Admitting: Radiation Oncology

## 2023-12-26 ENCOUNTER — Other Ambulatory Visit: Payer: Self-pay

## 2023-12-26 DIAGNOSIS — C50411 Malignant neoplasm of upper-outer quadrant of right female breast: Secondary | ICD-10-CM | POA: Diagnosis not present

## 2023-12-26 DIAGNOSIS — Z510 Encounter for antineoplastic radiation therapy: Secondary | ICD-10-CM | POA: Diagnosis not present

## 2023-12-26 DIAGNOSIS — Z170 Estrogen receptor positive status [ER+]: Secondary | ICD-10-CM | POA: Diagnosis not present

## 2023-12-26 LAB — RAD ONC ARIA SESSION SUMMARY
Course Elapsed Days: 2
Plan Fractions Treated to Date: 3
Plan Prescribed Dose Per Fraction: 2.67 Gy
Plan Total Fractions Prescribed: 15
Plan Total Prescribed Dose: 40.05 Gy
Reference Point Dosage Given to Date: 8.01 Gy
Reference Point Session Dosage Given: 2.67 Gy
Session Number: 3

## 2023-12-27 ENCOUNTER — Ambulatory Visit
Admission: RE | Admit: 2023-12-27 | Discharge: 2023-12-27 | Disposition: A | Discharge status: Home / Self Care | Source: Ambulatory Visit | Attending: Radiation Oncology | Admitting: Radiation Oncology

## 2023-12-27 ENCOUNTER — Other Ambulatory Visit: Payer: Self-pay

## 2023-12-27 DIAGNOSIS — Z510 Encounter for antineoplastic radiation therapy: Secondary | ICD-10-CM | POA: Diagnosis not present

## 2023-12-27 DIAGNOSIS — C50411 Malignant neoplasm of upper-outer quadrant of right female breast: Secondary | ICD-10-CM | POA: Diagnosis not present

## 2023-12-27 DIAGNOSIS — Z170 Estrogen receptor positive status [ER+]: Secondary | ICD-10-CM | POA: Diagnosis not present

## 2023-12-27 LAB — RAD ONC ARIA SESSION SUMMARY
Course Elapsed Days: 3
Plan Fractions Treated to Date: 4
Plan Prescribed Dose Per Fraction: 2.67 Gy
Plan Total Fractions Prescribed: 15
Plan Total Prescribed Dose: 40.05 Gy
Reference Point Dosage Given to Date: 10.68 Gy
Reference Point Session Dosage Given: 2.67 Gy
Session Number: 4

## 2023-12-28 ENCOUNTER — Other Ambulatory Visit: Payer: Self-pay

## 2023-12-28 ENCOUNTER — Ambulatory Visit
Admission: RE | Admit: 2023-12-28 | Discharge: 2023-12-28 | Disposition: A | Discharge status: Home / Self Care | Source: Ambulatory Visit | Attending: Radiation Oncology | Admitting: Radiation Oncology

## 2023-12-28 DIAGNOSIS — Z170 Estrogen receptor positive status [ER+]: Secondary | ICD-10-CM | POA: Diagnosis not present

## 2023-12-28 DIAGNOSIS — C50411 Malignant neoplasm of upper-outer quadrant of right female breast: Secondary | ICD-10-CM | POA: Diagnosis not present

## 2023-12-28 DIAGNOSIS — Z510 Encounter for antineoplastic radiation therapy: Secondary | ICD-10-CM | POA: Diagnosis not present

## 2023-12-28 LAB — RAD ONC ARIA SESSION SUMMARY
Course Elapsed Days: 4
Plan Fractions Treated to Date: 5
Plan Prescribed Dose Per Fraction: 2.67 Gy
Plan Total Fractions Prescribed: 15
Plan Total Prescribed Dose: 40.05 Gy
Reference Point Dosage Given to Date: 13.35 Gy
Reference Point Session Dosage Given: 2.67 Gy
Session Number: 5

## 2024-01-01 ENCOUNTER — Other Ambulatory Visit: Payer: Self-pay

## 2024-01-01 ENCOUNTER — Ambulatory Visit
Admission: RE | Admit: 2024-01-01 | Discharge: 2024-01-01 | Disposition: A | Discharge status: Home / Self Care | Source: Ambulatory Visit | Attending: Radiation Oncology | Admitting: Radiation Oncology

## 2024-01-01 DIAGNOSIS — Z510 Encounter for antineoplastic radiation therapy: Secondary | ICD-10-CM | POA: Diagnosis not present

## 2024-01-01 DIAGNOSIS — Z170 Estrogen receptor positive status [ER+]: Secondary | ICD-10-CM | POA: Diagnosis not present

## 2024-01-01 DIAGNOSIS — C50411 Malignant neoplasm of upper-outer quadrant of right female breast: Secondary | ICD-10-CM | POA: Diagnosis not present

## 2024-01-01 LAB — RAD ONC ARIA SESSION SUMMARY
Course Elapsed Days: 8
Plan Fractions Treated to Date: 6
Plan Prescribed Dose Per Fraction: 2.67 Gy
Plan Total Fractions Prescribed: 15
Plan Total Prescribed Dose: 40.05 Gy
Reference Point Dosage Given to Date: 16.02 Gy
Reference Point Session Dosage Given: 2.67 Gy
Session Number: 6

## 2024-01-02 ENCOUNTER — Ambulatory Visit

## 2024-01-03 ENCOUNTER — Other Ambulatory Visit: Payer: Self-pay

## 2024-01-03 ENCOUNTER — Ambulatory Visit
Admission: RE | Admit: 2024-01-03 | Discharge: 2024-01-03 | Disposition: A | Discharge status: Home / Self Care | Source: Ambulatory Visit | Attending: Radiation Oncology | Admitting: Radiation Oncology

## 2024-01-03 DIAGNOSIS — Z510 Encounter for antineoplastic radiation therapy: Secondary | ICD-10-CM | POA: Diagnosis not present

## 2024-01-03 LAB — RAD ONC ARIA SESSION SUMMARY
Course Elapsed Days: 10
Plan Fractions Treated to Date: 7
Plan Prescribed Dose Per Fraction: 2.67 Gy
Plan Total Fractions Prescribed: 15
Plan Total Prescribed Dose: 40.05 Gy
Reference Point Dosage Given to Date: 18.69 Gy
Reference Point Session Dosage Given: 2.67 Gy
Session Number: 7

## 2024-01-04 ENCOUNTER — Ambulatory Visit
Admission: RE | Admit: 2024-01-04 | Discharge: 2024-01-04 | Disposition: A | Discharge status: Home / Self Care | Source: Ambulatory Visit | Attending: Radiation Oncology | Admitting: Radiation Oncology

## 2024-01-04 ENCOUNTER — Other Ambulatory Visit: Payer: Self-pay

## 2024-01-04 DIAGNOSIS — Z510 Encounter for antineoplastic radiation therapy: Secondary | ICD-10-CM | POA: Diagnosis not present

## 2024-01-04 LAB — RAD ONC ARIA SESSION SUMMARY
Course Elapsed Days: 11
Plan Fractions Treated to Date: 8
Plan Prescribed Dose Per Fraction: 2.67 Gy
Plan Total Fractions Prescribed: 15
Plan Total Prescribed Dose: 40.05 Gy
Reference Point Dosage Given to Date: 21.36 Gy
Reference Point Session Dosage Given: 2.67 Gy
Session Number: 8

## 2024-01-07 ENCOUNTER — Ambulatory Visit

## 2024-01-07 ENCOUNTER — Ambulatory Visit
Admission: RE | Admit: 2024-01-07 | Discharge: 2024-01-07 | Disposition: A | Discharge status: Home / Self Care | Source: Ambulatory Visit | Attending: Radiation Oncology | Admitting: Radiation Oncology

## 2024-01-07 ENCOUNTER — Ambulatory Visit: Admitting: Radiation Oncology

## 2024-01-07 DIAGNOSIS — C50411 Malignant neoplasm of upper-outer quadrant of right female breast: Secondary | ICD-10-CM | POA: Diagnosis not present

## 2024-01-07 DIAGNOSIS — Z510 Encounter for antineoplastic radiation therapy: Secondary | ICD-10-CM | POA: Diagnosis not present

## 2024-01-07 DIAGNOSIS — Z170 Estrogen receptor positive status [ER+]: Secondary | ICD-10-CM | POA: Diagnosis not present

## 2024-01-08 ENCOUNTER — Ambulatory Visit

## 2024-01-08 ENCOUNTER — Other Ambulatory Visit: Payer: Self-pay

## 2024-01-08 DIAGNOSIS — Z170 Estrogen receptor positive status [ER+]: Secondary | ICD-10-CM | POA: Diagnosis not present

## 2024-01-08 DIAGNOSIS — C50411 Malignant neoplasm of upper-outer quadrant of right female breast: Secondary | ICD-10-CM | POA: Diagnosis not present

## 2024-01-08 DIAGNOSIS — Z510 Encounter for antineoplastic radiation therapy: Secondary | ICD-10-CM | POA: Diagnosis not present

## 2024-01-08 LAB — RAD ONC ARIA SESSION SUMMARY
Course Elapsed Days: 15
Plan Fractions Treated to Date: 9
Plan Prescribed Dose Per Fraction: 2.67 Gy
Plan Total Fractions Prescribed: 15
Plan Total Prescribed Dose: 40.05 Gy
Reference Point Dosage Given to Date: 24.03 Gy
Reference Point Session Dosage Given: 2.67 Gy
Session Number: 9

## 2024-01-09 ENCOUNTER — Ambulatory Visit
Admission: RE | Admit: 2024-01-09 | Discharge: 2024-01-09 | Disposition: A | Discharge status: Home / Self Care | Source: Ambulatory Visit | Attending: Radiation Oncology | Admitting: Radiation Oncology

## 2024-01-09 ENCOUNTER — Other Ambulatory Visit: Payer: Self-pay

## 2024-01-09 DIAGNOSIS — Z510 Encounter for antineoplastic radiation therapy: Secondary | ICD-10-CM | POA: Diagnosis not present

## 2024-01-09 DIAGNOSIS — Z170 Estrogen receptor positive status [ER+]: Secondary | ICD-10-CM | POA: Diagnosis not present

## 2024-01-09 DIAGNOSIS — C50411 Malignant neoplasm of upper-outer quadrant of right female breast: Secondary | ICD-10-CM | POA: Diagnosis not present

## 2024-01-09 LAB — RAD ONC ARIA SESSION SUMMARY
Course Elapsed Days: 16
Plan Fractions Treated to Date: 10
Plan Prescribed Dose Per Fraction: 2.67 Gy
Plan Total Fractions Prescribed: 15
Plan Total Prescribed Dose: 40.05 Gy
Reference Point Dosage Given to Date: 26.7 Gy
Reference Point Session Dosage Given: 2.67 Gy
Session Number: 10

## 2024-01-10 ENCOUNTER — Ambulatory Visit
Admission: RE | Admit: 2024-01-10 | Discharge: 2024-01-10 | Disposition: A | Discharge status: Home / Self Care | Source: Ambulatory Visit | Attending: Radiation Oncology

## 2024-01-10 ENCOUNTER — Other Ambulatory Visit: Payer: Self-pay

## 2024-01-10 DIAGNOSIS — Z510 Encounter for antineoplastic radiation therapy: Secondary | ICD-10-CM | POA: Diagnosis not present

## 2024-01-10 DIAGNOSIS — Z170 Estrogen receptor positive status [ER+]: Secondary | ICD-10-CM | POA: Diagnosis not present

## 2024-01-10 DIAGNOSIS — C50411 Malignant neoplasm of upper-outer quadrant of right female breast: Secondary | ICD-10-CM | POA: Diagnosis not present

## 2024-01-10 LAB — RAD ONC ARIA SESSION SUMMARY
Course Elapsed Days: 17
Plan Fractions Treated to Date: 11
Plan Prescribed Dose Per Fraction: 2.67 Gy
Plan Total Fractions Prescribed: 15
Plan Total Prescribed Dose: 40.05 Gy
Reference Point Dosage Given to Date: 29.37 Gy
Reference Point Session Dosage Given: 2.67 Gy
Session Number: 11

## 2024-01-11 ENCOUNTER — Other Ambulatory Visit: Payer: Self-pay

## 2024-01-11 ENCOUNTER — Ambulatory Visit
Admission: RE | Admit: 2024-01-11 | Discharge: 2024-01-11 | Disposition: A | Discharge status: Home / Self Care | Source: Ambulatory Visit | Attending: Radiation Oncology | Admitting: Radiation Oncology

## 2024-01-11 DIAGNOSIS — C50411 Malignant neoplasm of upper-outer quadrant of right female breast: Secondary | ICD-10-CM | POA: Diagnosis not present

## 2024-01-11 DIAGNOSIS — Z170 Estrogen receptor positive status [ER+]: Secondary | ICD-10-CM | POA: Diagnosis not present

## 2024-01-11 DIAGNOSIS — Z510 Encounter for antineoplastic radiation therapy: Secondary | ICD-10-CM | POA: Diagnosis not present

## 2024-01-11 LAB — RAD ONC ARIA SESSION SUMMARY
Course Elapsed Days: 18
Plan Fractions Treated to Date: 12
Plan Prescribed Dose Per Fraction: 2.67 Gy
Plan Total Fractions Prescribed: 15
Plan Total Prescribed Dose: 40.05 Gy
Reference Point Dosage Given to Date: 32.04 Gy
Reference Point Session Dosage Given: 2.67 Gy
Session Number: 12

## 2024-01-14 ENCOUNTER — Ambulatory Visit
Admission: RE | Admit: 2024-01-14 | Discharge: 2024-01-14 | Disposition: A | Discharge status: Home / Self Care | Source: Ambulatory Visit | Attending: Radiation Oncology | Admitting: Radiation Oncology

## 2024-01-14 ENCOUNTER — Other Ambulatory Visit: Payer: Self-pay

## 2024-01-14 ENCOUNTER — Ambulatory Visit
Admission: RE | Admit: 2024-01-14 | Discharge: 2024-01-14 | Disposition: A | Discharge status: Home / Self Care | Source: Ambulatory Visit | Attending: Radiation Oncology

## 2024-01-14 DIAGNOSIS — Z170 Estrogen receptor positive status [ER+]: Secondary | ICD-10-CM | POA: Diagnosis not present

## 2024-01-14 DIAGNOSIS — Z510 Encounter for antineoplastic radiation therapy: Secondary | ICD-10-CM | POA: Diagnosis not present

## 2024-01-14 DIAGNOSIS — C50411 Malignant neoplasm of upper-outer quadrant of right female breast: Secondary | ICD-10-CM | POA: Diagnosis not present

## 2024-01-14 LAB — RAD ONC ARIA SESSION SUMMARY
Course Elapsed Days: 21
Plan Fractions Treated to Date: 13
Plan Prescribed Dose Per Fraction: 2.67 Gy
Plan Total Fractions Prescribed: 15
Plan Total Prescribed Dose: 40.05 Gy
Reference Point Dosage Given to Date: 34.71 Gy
Reference Point Session Dosage Given: 2.67 Gy
Session Number: 13

## 2024-01-15 ENCOUNTER — Other Ambulatory Visit: Payer: Self-pay

## 2024-01-15 ENCOUNTER — Ambulatory Visit
Admission: RE | Admit: 2024-01-15 | Discharge: 2024-01-15 | Disposition: A | Discharge status: Home / Self Care | Source: Ambulatory Visit | Attending: Radiation Oncology | Admitting: Radiation Oncology

## 2024-01-15 DIAGNOSIS — Z510 Encounter for antineoplastic radiation therapy: Secondary | ICD-10-CM | POA: Diagnosis not present

## 2024-01-15 DIAGNOSIS — Z170 Estrogen receptor positive status [ER+]: Secondary | ICD-10-CM | POA: Diagnosis not present

## 2024-01-15 DIAGNOSIS — C50411 Malignant neoplasm of upper-outer quadrant of right female breast: Secondary | ICD-10-CM | POA: Diagnosis not present

## 2024-01-15 LAB — RAD ONC ARIA SESSION SUMMARY
Course Elapsed Days: 22
Plan Fractions Treated to Date: 14
Plan Prescribed Dose Per Fraction: 2.67 Gy
Plan Total Fractions Prescribed: 15
Plan Total Prescribed Dose: 40.05 Gy
Reference Point Dosage Given to Date: 37.38 Gy
Reference Point Session Dosage Given: 2.67 Gy
Session Number: 14

## 2024-01-16 ENCOUNTER — Other Ambulatory Visit: Payer: Self-pay

## 2024-01-16 ENCOUNTER — Inpatient Hospital Stay: Attending: Hematology and Oncology | Admitting: Hematology and Oncology

## 2024-01-16 ENCOUNTER — Ambulatory Visit
Admission: RE | Admit: 2024-01-16 | Discharge: 2024-01-16 | Disposition: A | Discharge status: Home / Self Care | Source: Ambulatory Visit | Attending: Radiation Oncology | Admitting: Radiation Oncology

## 2024-01-16 VITALS — BP 135/75 | HR 112 | Temp 97.7°F | Resp 17 | Wt 164.6 lb

## 2024-01-16 DIAGNOSIS — C50411 Malignant neoplasm of upper-outer quadrant of right female breast: Secondary | ICD-10-CM | POA: Diagnosis not present

## 2024-01-16 DIAGNOSIS — Z510 Encounter for antineoplastic radiation therapy: Secondary | ICD-10-CM | POA: Diagnosis not present

## 2024-01-16 DIAGNOSIS — Z170 Estrogen receptor positive status [ER+]: Secondary | ICD-10-CM | POA: Insufficient documentation

## 2024-01-16 LAB — RAD ONC ARIA SESSION SUMMARY
Course Elapsed Days: 23
Plan Fractions Treated to Date: 15
Plan Prescribed Dose Per Fraction: 2.67 Gy
Plan Total Fractions Prescribed: 15
Plan Total Prescribed Dose: 40.05 Gy
Reference Point Dosage Given to Date: 40.05 Gy
Reference Point Session Dosage Given: 2.67 Gy
Session Number: 15

## 2024-01-16 MED ORDER — ANASTROZOLE 1 MG PO TABS
1.0000 mg | ORAL_TABLET | Freq: Every day | ORAL | 3 refills | Status: AC
  Filled 2024-01-16: qty 90, 90d supply, fill #0
  Filled 2024-04-11: qty 90, 90d supply, fill #1
  Filled 2024-07-10: qty 90, 90d supply, fill #2

## 2024-01-16 NOTE — Assessment & Plan Note (Signed)
 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 These include but not limited to insomnia, hot flashes, mood changes, vaginal dryness, bone density loss, and weight gain. We strongly believe that the benefits far outweigh the risks. Patient understands these risks and consented to starting treatment. Planned treatment duration is 7 years.  RTC in 3 months for SCP visit

## 2024-01-16 NOTE — Progress Notes (Signed)
 Patient Care Team: Perley Bradley, MD as PCP - General (Family Medicine) Auther Bo, RN as Oncology Nurse Navigator Alane Hsu, RN as Oncology Nurse Navigator Cameron Cea, MD as Consulting Physician (Hematology and Oncology)  DIAGNOSIS:  Encounter Diagnosis  Name Primary?   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%, HER2 0 negative   11/07/2023 Cancer Staging   Staging form: Breast, AJCC 8th Edition - Clinical: Stage IA (cT1b, cN0, cM0, G2, ER+, PR+, HER2-) - Signed by Cameron Cea, MD on 11/07/2023 Stage prefix: Initial diagnosis Histologic grading system: 3 grade system   11/14/2023 Surgery   Right lumpectomy: Grade 2 IDC 3 cm with intermediate grade DCIS, margins negative, LVI not identified, right superior margin excision: Microscopic focus of ILC 0.1 cm, inferior margin excision: Microscopic focus of ILC 0.6 cm, ER 60%, PR 95%, HER2 0, Ki-67 5%   11/21/2023 Cancer Staging   Staging form: Breast, AJCC 8th Edition - Pathologic: Stage IA (pT2, pN0, cM0, G2, ER+, PR+, HER2-) - Signed by Cameron Cea, MD on 11/21/2023 Stage prefix: Initial diagnosis Histologic grading system: 3 grade system     CHIEF COMPLIANT:   HISTORY OF PRESENT ILLNESS:  History of Present Illness [name] is a 68 year old female undergoing radiation therapy who presents for follow-up regarding her treatment and medication management.  She is in the final week of  radiation therapy and experiences fatigue, which affects her productivity. She manages this by taking naps as needed. She is preparing to initiate anastrozole to reduce estrogen production. Her current medications include multivitamins and three other unspecified medications. Her bone density is good, and she takes vitamin D regularly.     ALLERGIES:  is allergic to doxycycline.  MEDICATIONS:  Current Outpatient Medications  Medication Sig Dispense Refill   [START ON 02/19/2024] anastrozole (ARIMIDEX) 1 MG tablet Take 1 tablet (1 mg total) by mouth daily. 90 tablet 3   albuterol  (VENTOLIN  HFA) 108 (90 Base) MCG/ACT inhaler Inhale 2 puffs by mouth every 6 hours as needed. (Patient not taking: Reported on 12/11/2023) 54 g 0   atorvastatin  (LIPITOR) 40 MG tablet Take 1 tablet (40 mg total) by mouth daily. (Patient taking differently: Take 40 mg by mouth at bedtime.) 90 tablet 1   B Complex Vitamins (VITAMIN B COMPLEX) CAPS Take 1 capsule by mouth at bedtime.     budesonide -formoterol  (SYMBICORT ) 80-4.5 MCG/ACT inhaler Inhale 2 puffs every 6 hours as needed for shortness of breath (Patient taking differently: Inhale 2 puffs into the lungs every 6 (six) hours as needed (Shortness of breath).) 10.2 g 2   Cholecalciferol 50 MCG (2000 UT) CAPS Take 1 capsule by mouth daily at 12 noon.     Diclofenac Sodium (VOLTAREN PO) Take 1 tablet by mouth 2 (two) times daily as needed (pain).     DULoxetine  (CYMBALTA ) 60 MG capsule Take 1 capsule (60 mg total) by mouth daily. 90 capsule 1   losartan  (COZAAR ) 25 MG tablet Take 1 tablet (25 mg total)  by mouth daily. 90 tablet 1   Multiple Vitamin (MULTI VITAMIN) TABS Take 1 tablet by mouth daily at 12 noon.     Omega 3 1000 MG CAPS Take 1 capsule by mouth daily at 12 noon.     omeprazole (PRILOSEC) 20 MG capsule Take 20 mg by mouth daily as needed (Indigestion).     Turmeric 400 MG CAPS Take 400 mg by mouth daily at 12 noon.     Zinc 20 MG CAPS Take 20 mg by mouth daily  at 12 noon.     No current facility-administered medications for this visit.    PHYSICAL EXAMINATION: ECOG PERFORMANCE STATUS: 1 - Symptomatic but completely ambulatory  Vitals:   01/16/24 1501 01/16/24 1502  BP: (!) 143/79 135/75  Pulse: (!) 112   Resp: 17   Temp: 97.7 F (36.5 C)   SpO2: 96%    Filed Weights   01/16/24 1501  Weight: 164 lb 9.6 oz (74.7 kg)    LABORATORY DATA:  I have reviewed the data as listed    Latest Ref Rng & Units 03/16/2011    8:06 AM 07/13/2010    9:21 AM 03/15/2010   12:00 AM  CMP  AST 0 - 37 U/L 25  34  36     No results found for: WBC, HGB, HCT, MCV, PLT, NEUTROABS  ASSESSMENT & PLAN:  Malignant neoplasm of upper-outer quadrant of right breast in female, estrogen receptor positive (HCC) 10/17/2023: Screening mammogram detected right breast asymmetry and new palpable area of concern UOQ right breast, ultrasound irregular hypoechoic mass 10 o'clock position 5 cm from the nipple 0.9 cm, axilla negative, biopsy: 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 These include but not limited to insomnia, hot flashes, mood changes, vaginal dryness, bone density loss, and weight gain. We strongly believe that the benefits far outweigh the risks. Patient understands these risks and consented to starting treatment. Planned treatment duration is 7 years.  RTC in 3 months for SCP visit  ------------------------------------- Assessment and Plan Assessment & Plan Breast  cancer undergoing radiation therapy She is in the final week of radiation therapy. Anastrozole will be initiated to reduce estrogen production. Bone health monitoring is crucial due to osteoporosis risk associated with anastrozole. Current bone density is good. - Begin anastrozole 1 mg daily starting February 19, 2024. - Prescribe 90 pills with three refills. - Conduct bone density test every two years. - Ensure adequate calcium  and vitamin D intake. - Continue regular exercise to support bone health.      No orders of the defined types were placed in this encounter.  The patient has a good understanding of the overall plan. she agrees with it. she will call with any problems that may develop before the next visit here. Total time spent: 30 mins including face to face time and time spent for planning, charting and co-ordination of care   Viinay K Kimbery Harwood, MD 01/16/24

## 2024-01-17 ENCOUNTER — Ambulatory Visit
Admission: RE | Admit: 2024-01-17 | Discharge: 2024-01-17 | Disposition: A | Discharge status: Home / Self Care | Source: Ambulatory Visit | Attending: Radiation Oncology | Admitting: Radiation Oncology

## 2024-01-17 ENCOUNTER — Other Ambulatory Visit: Payer: Self-pay

## 2024-01-17 DIAGNOSIS — Z510 Encounter for antineoplastic radiation therapy: Secondary | ICD-10-CM | POA: Diagnosis not present

## 2024-01-17 LAB — RAD ONC ARIA SESSION SUMMARY
Course Elapsed Days: 24
Plan Fractions Treated to Date: 1
Plan Prescribed Dose Per Fraction: 2 Gy
Plan Total Fractions Prescribed: 5
Plan Total Prescribed Dose: 10 Gy
Reference Point Dosage Given to Date: 2 Gy
Reference Point Session Dosage Given: 2 Gy
Session Number: 16

## 2024-01-18 ENCOUNTER — Ambulatory Visit
Admission: RE | Admit: 2024-01-18 | Discharge: 2024-01-18 | Disposition: A | Discharge status: Home / Self Care | Source: Ambulatory Visit | Attending: Radiation Oncology | Admitting: Radiation Oncology

## 2024-01-18 ENCOUNTER — Other Ambulatory Visit: Payer: Self-pay

## 2024-01-18 DIAGNOSIS — Z510 Encounter for antineoplastic radiation therapy: Secondary | ICD-10-CM | POA: Diagnosis not present

## 2024-01-18 LAB — RAD ONC ARIA SESSION SUMMARY
Course Elapsed Days: 25
Plan Fractions Treated to Date: 2
Plan Prescribed Dose Per Fraction: 2 Gy
Plan Total Fractions Prescribed: 5
Plan Total Prescribed Dose: 10 Gy
Reference Point Dosage Given to Date: 4 Gy
Reference Point Session Dosage Given: 2 Gy
Session Number: 17

## 2024-01-21 ENCOUNTER — Ambulatory Visit
Admission: RE | Admit: 2024-01-21 | Discharge: 2024-01-21 | Disposition: A | Discharge status: Home / Self Care | Source: Ambulatory Visit | Attending: Radiation Oncology

## 2024-01-21 ENCOUNTER — Other Ambulatory Visit (HOSPITAL_COMMUNITY): Payer: Self-pay

## 2024-01-21 ENCOUNTER — Other Ambulatory Visit: Payer: Self-pay

## 2024-01-21 ENCOUNTER — Ambulatory Visit

## 2024-01-21 DIAGNOSIS — Z510 Encounter for antineoplastic radiation therapy: Secondary | ICD-10-CM | POA: Diagnosis not present

## 2024-01-21 LAB — RAD ONC ARIA SESSION SUMMARY
Course Elapsed Days: 28
Plan Fractions Treated to Date: 3
Plan Prescribed Dose Per Fraction: 2 Gy
Plan Total Fractions Prescribed: 5
Plan Total Prescribed Dose: 10 Gy
Reference Point Dosage Given to Date: 6 Gy
Reference Point Session Dosage Given: 2 Gy
Session Number: 18

## 2024-01-21 MED ORDER — ATORVASTATIN CALCIUM 40 MG PO TABS
40.0000 mg | ORAL_TABLET | Freq: Every day | ORAL | 1 refills | Status: DC
  Filled 2024-01-21: qty 90, 90d supply, fill #0
  Filled 2024-04-22: qty 90, 90d supply, fill #1

## 2024-01-22 ENCOUNTER — Other Ambulatory Visit: Payer: Self-pay

## 2024-01-22 ENCOUNTER — Ambulatory Visit

## 2024-01-22 ENCOUNTER — Ambulatory Visit
Admission: RE | Admit: 2024-01-22 | Discharge: 2024-01-22 | Disposition: A | Discharge status: Home / Self Care | Source: Ambulatory Visit | Attending: Radiation Oncology | Admitting: Radiation Oncology

## 2024-01-22 DIAGNOSIS — Z510 Encounter for antineoplastic radiation therapy: Secondary | ICD-10-CM | POA: Diagnosis not present

## 2024-01-22 LAB — RAD ONC ARIA SESSION SUMMARY
Course Elapsed Days: 29
Plan Fractions Treated to Date: 4
Plan Prescribed Dose Per Fraction: 2 Gy
Plan Total Fractions Prescribed: 5
Plan Total Prescribed Dose: 10 Gy
Reference Point Dosage Given to Date: 8 Gy
Reference Point Session Dosage Given: 2 Gy
Session Number: 19

## 2024-01-23 ENCOUNTER — Ambulatory Visit
Admission: RE | Admit: 2024-01-23 | Discharge: 2024-01-23 | Disposition: A | Discharge status: Home / Self Care | Source: Ambulatory Visit | Attending: Radiation Oncology | Admitting: Radiation Oncology

## 2024-01-23 ENCOUNTER — Other Ambulatory Visit: Payer: Self-pay

## 2024-01-23 DIAGNOSIS — C50411 Malignant neoplasm of upper-outer quadrant of right female breast: Secondary | ICD-10-CM | POA: Diagnosis not present

## 2024-01-23 DIAGNOSIS — Z170 Estrogen receptor positive status [ER+]: Secondary | ICD-10-CM | POA: Diagnosis not present

## 2024-01-23 DIAGNOSIS — Z510 Encounter for antineoplastic radiation therapy: Secondary | ICD-10-CM | POA: Diagnosis not present

## 2024-01-23 LAB — RAD ONC ARIA SESSION SUMMARY
Course Elapsed Days: 30
Plan Fractions Treated to Date: 5
Plan Prescribed Dose Per Fraction: 2 Gy
Plan Total Fractions Prescribed: 5
Plan Total Prescribed Dose: 10 Gy
Reference Point Dosage Given to Date: 10 Gy
Reference Point Session Dosage Given: 2 Gy
Session Number: 20

## 2024-01-24 NOTE — Radiation Completion Notes (Signed)
 Patient Name: [name], [name] MRN: [id number] Date of Birth: [birth year] Referring Physician: Cameron Cea, M.D. Date of Service: 2024-01-24 Radiation Oncologist: Colie Dawes, M.D. [facility] [place]                             RADIATION ONCOLOGY END OF TREATMENT NOTE     Diagnosis: C50.411 Malignant neoplasm of upper-outer quadrant of right female breast Staging on 2023-11-21: Malignant neoplasm of upper-outer quadrant of right breast in female, estrogen receptor positive (HCC) T=pT2, N=pN0, M=cM0 Staging on 2023-11-07: Malignant neoplasm of upper-outer quadrant of right breast in female, estrogen receptor positive (HCC) T=cT1b, N=cN0, M=cM0 Intent: Curative     ==========DELIVERED PLANS==========  First Treatment Date: 2023-12-24 Last Treatment Date: 2024-01-23   Plan Name: Breast_R Site: Breast, Right Technique: 3D Mode: Photon Dose Per Fraction: 2.67 Gy Prescribed Dose (Delivered / Prescribed): 40.05 Gy / 40.05 Gy Prescribed Fxs (Delivered / Prescribed): 15 / 15   Plan Name: Breast_R_Bst Site: Breast, Right Technique: 3D Mode: Photon Dose Per Fraction: 2 Gy Prescribed Dose (Delivered / Prescribed): 10 Gy / 10 Gy Prescribed Fxs (Delivered / Prescribed): 5 / 5     ==========ON TREATMENT VISIT DATES========== 2023-12-24, 2024-01-08, 2024-01-14, 2024-01-21     ==========UPCOMING VISITS==========       ==========APPENDIX - ON TREATMENT VISIT NOTES==========   See weekly On Treatment Notes in Epic for details in the Media tab (listed as Progress notes on the On Treatment Visit Dates listed above).

## 2024-02-07 ENCOUNTER — Other Ambulatory Visit: Payer: Self-pay | Admitting: General Surgery

## 2024-02-07 DIAGNOSIS — L905 Scar conditions and fibrosis of skin: Secondary | ICD-10-CM

## 2024-02-13 ENCOUNTER — Other Ambulatory Visit (HOSPITAL_COMMUNITY): Payer: Self-pay

## 2024-02-20 ENCOUNTER — Other Ambulatory Visit (HOSPITAL_COMMUNITY): Payer: Self-pay

## 2024-02-20 NOTE — Progress Notes (Addendum)
  [name] presents today for follow-up after completing radiation to her right breast  on 01/23/2024 Called Ms. [name] and she let me know she is doing really well.   Pain: Denies any pain Skin: Skin on treatment area is starting to get better  ROM: Denies Lymphedema: Denies MedOnc F/U: 04/22/2024 with Morna, NP Other issues of note:  None  Pt reports Yes No Comments  Tamoxifen []  [x]    Letrozole []  [x]    Anastrazole [x]  []    Mammogram [x]  Date: March 12,2025 []

## 2024-02-21 ENCOUNTER — Ambulatory Visit
Admission: RE | Admit: 2024-02-21 | Discharge: 2024-02-21 | Disposition: A | Discharge status: Home / Self Care | Source: Ambulatory Visit | Attending: Radiation Oncology | Admitting: Radiation Oncology

## 2024-02-21 DIAGNOSIS — C50411 Malignant neoplasm of upper-outer quadrant of right female breast: Secondary | ICD-10-CM

## 2024-02-22 ENCOUNTER — Ambulatory Visit
Admission: RE | Admit: 2024-02-22 | Discharge: 2024-02-22 | Disposition: A | Discharge status: Home / Self Care | Source: Ambulatory Visit | Attending: General Surgery | Admitting: General Surgery

## 2024-02-22 DIAGNOSIS — N6311 Unspecified lump in the right breast, upper outer quadrant: Secondary | ICD-10-CM | POA: Diagnosis not present

## 2024-02-22 DIAGNOSIS — L905 Scar conditions and fibrosis of skin: Secondary | ICD-10-CM

## 2024-04-11 ENCOUNTER — Other Ambulatory Visit: Payer: Self-pay

## 2024-04-22 ENCOUNTER — Other Ambulatory Visit (HOSPITAL_COMMUNITY): Payer: Self-pay

## 2024-04-22 ENCOUNTER — Inpatient Hospital Stay: Attending: Radiation Oncology | Admitting: Adult Health

## 2024-04-22 ENCOUNTER — Encounter: Payer: Self-pay | Admitting: Adult Health

## 2024-04-22 VITALS — BP 148/86 | HR 101 | Temp 97.8°F | Resp 16 | Wt 169.1 lb

## 2024-04-22 DIAGNOSIS — Z170 Estrogen receptor positive status [ER+]: Secondary | ICD-10-CM | POA: Diagnosis not present

## 2024-04-22 DIAGNOSIS — C50411 Malignant neoplasm of upper-outer quadrant of right female breast: Secondary | ICD-10-CM | POA: Diagnosis not present

## 2024-04-22 DIAGNOSIS — Z17411 Hormone receptor positive with human epidermal growth factor receptor 2 negative status: Secondary | ICD-10-CM | POA: Insufficient documentation

## 2024-04-22 DIAGNOSIS — Z79811 Long term (current) use of aromatase inhibitors: Secondary | ICD-10-CM | POA: Diagnosis not present

## 2024-04-22 NOTE — Progress Notes (Unsigned)
 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%, HER2 0 negative   11/07/2023 Cancer Staging   Staging form: Breast, AJCC 8th Edition - Clinical: Stage IA (cT1b, cN0, cM0, G2, ER+, PR+, HER2-) - Signed by Odean Potts, MD on 11/07/2023 Stage prefix: Initial diagnosis Histologic grading system: 3 grade system   11/14/2023 Surgery   Right lumpectomy: Grade 2 IDC 3 cm with intermediate grade DCIS, margins negative, LVI not identified, right superior margin excision: Microscopic focus of ILC 0.1 cm, inferior margin excision: Microscopic focus of ILC 0.6 cm, ER 60%, PR 95%, HER2 0, Ki-67 5%   11/21/2023 Cancer Staging   Staging form: Breast, AJCC 8th Edition - Pathologic: Stage IA (pT2, pN0, cM0, G2, ER+, PR+, HER2-) - Signed by Odean Potts, MD on 11/21/2023 Stage prefix: Initial diagnosis Histologic grading system: 3 grade system   12/24/2023 - 01/23/2024 Radiation Therapy   Plan Name: Breast_R Site: Breast, Right Technique: 3D Mode: Photon Dose Per Fraction: 2.67 Gy Prescribed Dose (Delivered / Prescribed): 40.05 Gy / 40.05 Gy Prescribed Fxs (Delivered / Prescribed): 15 / 15   Plan Name: Breast_R_Bst Site: Breast, Right Technique: 3D Mode: Photon Dose Per Fraction: 2 Gy Prescribed Dose (Delivered / Prescribed): 10 Gy / 10 Gy Prescribed Fxs (Delivered / Prescribed): 5 / 5   02/2024 -  Anti-estrogen oral therapy   1 mg Anastrozole      INTERVAL HISTORY:  [name] to review her survivorship care plan detailing her treatment course for breast cancer, as well as  monitoring long-term side effects of that treatment, education regarding health maintenance, screening, and overall wellness and health promotion.     Overall, [name] reports feeling quite well   REVIEW OF SYSTEMS:  Review of Systems - Oncology Breast: Denies any new nodularity, masses, tenderness, nipple changes, or nipple discharge.       PAST MEDICAL/SURGICAL HISTORY:  Past Medical History:  Diagnosis Date  . Asthma   . Chronic constipation   . Depression   . History of echocardiogram    a. Echo 10/17: Mild concentric LVH, vigorous LVF, EF 65-70, normal wall motion, grade 1 diastolic dysfunction, GLS -24.4%,  . History of stress test    a. ETT-Echo 10/17: normal  . HLD (hyperlipidemia)   . Hypertension   . Pre-diabetes    Past Surgical History:  Procedure Laterality Date  . ABDOMINAL HYSTERECTOMY    . BREAST BIOPSY Right 10/17/2023   US  RT BREAST BX W LOC DEV 1ST LESION IMG BX SPEC US  GUIDE 10/17/2023 [facility]  . BREAST BIOPSY  11/13/2023   MM RT RADIOACTIVE SEED LOC MAMMO GUIDE 11/13/2023 [facility]  . BREAST LUMPECTOMY WITH RADIOACTIVE SEED LOCALIZATION Right 11/14/2023   Procedure: BREAST LUMPECTOMY WITH RADIOACTIVE SEED LOCALIZATION;  Surgeon: Ebbie Cough, MD;  Location: [facility];  Service: General;  Laterality: Right;  LMA, Right lumpectomy  . SHOULDER SURGERY Left   . TONSILLECTOMY       ALLERGIES:  Allergies  Allergen Reactions  . Doxycycline Nausea Only     CURRENT MEDICATIONS:  Outpatient Encounter Medications as of 04/22/2024  Medication  Sig Note  . albuterol  (VENTOLIN  HFA) 108 (90 Base) MCG/ACT inhaler Inhale 2 puffs by mouth every 6 hours as needed. (Patient not taking: Reported on 12/11/2023) 11/07/2023: The patient would like to have this refilled. Per patient, MD refused to fill without cortisone. Patient, RN and CPhT all questioning.   . anastrozole  (ARIMIDEX ) 1 MG tablet Take 1 tablet (1 mg total) by mouth daily.    . atorvastatin  (LIPITOR) 40 MG tablet Take 1 tablet (40 mg total) by mouth daily.   . B Complex Vitamins (VITAMIN B COMPLEX) CAPS Take 1 capsule by mouth at bedtime.   . budesonide -formoterol  (SYMBICORT ) 80-4.5 MCG/ACT inhaler Inhale 2 puffs every 6 hours as needed for shortness of breath (Patient taking differently: Inhale 2 puffs into the lungs every 6 (six) hours as needed (Shortness of breath).)   . Cholecalciferol 50 MCG (2000 UT) CAPS Take 1 capsule by mouth daily at 12 noon.   . Diclofenac Sodium (VOLTAREN PO) Take 1 tablet by mouth 2 (two) times daily as needed (pain).   . DULoxetine  (CYMBALTA ) 60 MG capsule Take 1 capsule (60 mg total) by mouth daily.   . losartan  (COZAAR ) 25 MG tablet Take 1 tablet (25 mg total) by mouth daily.   . Multiple Vitamin (MULTI VITAMIN) TABS Take 1 tablet by mouth daily at 12 noon.   . Omega 3 1000 MG CAPS Take 1 capsule by mouth daily at 12 noon.   SABRA omeprazole (PRILOSEC) 20 MG capsule Take 20 mg by mouth daily as needed (Indigestion).   . Turmeric 400 MG CAPS Take 400 mg by mouth daily at 12 noon.   . Zinc 20 MG CAPS Take 20 mg by mouth daily at 12 noon.    No facility-administered encounter medications on file as of 04/22/2024.     ONCOLOGIC FAMILY HISTORY:  Family History  Problem Relation Age of Onset  . Heart disease Father 19       s/p PCI; s/p CABG  . Skin cancer Neg Hx      SOCIAL HISTORY:  Social History   Socioeconomic History  . Marital status: Married    Spouse name: Not on file  . Number of children: Not on file  . Years of education: Not on file  . Highest education level: Not on file  Occupational History  . Not on file  Tobacco Use  . Smoking status: Former  . Smokeless tobacco: Never  Substance and Sexual Activity  . Alcohol use: Yes    Comment: 1-2 drink a day  . Drug use: Never    Comment: THC  . Sexual activity: Yes    Birth control/protection: Surgical    Comment: Hyst  Other Topics Concern  . 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   Originally from [place], [place]   Social Drivers of Health   Financial Resource Strain: Not on file  Food Insecurity: No Food Insecurity (11/16/2023)   Hunger Vital Sign   . Worried About Programme researcher, broadcasting/film/video in the Last Year: Never true   . Ran Out of Food in the Last Year: Never true  Transportation Needs: No Transportation Needs (11/16/2023)   PRAPARE - Transportation   . Lack of Transportation (Medical): No   . Lack of Transportation (Non-Medical): No  Physical Activity: Not on file  Stress: Not on file  Social Connections: Not on file  Intimate Partner Violence: Not At Risk (11/16/2023)   Humiliation, Afraid, Rape, and Kick questionnaire   . Fear of Current or Ex-Partner: No   . Emotionally Abused: No   . Physically Abused: No   . Sexually Abused: No     OBSERVATIONS/OBJECTIVE:  BP (!) 148/86 (BP Location: Left Arm, Patient Position: Sitting)   Pulse (!) 101   Temp 97.8 F (36.6 C) (Temporal)   Resp 16   Wt 169 lb 1.6 oz (76.7 kg)   SpO2 98%   BMI 31.69 kg/m  GENERAL: Patient is a well appearing female in no acute distress HEENT:  Sclerae anicteric.  Oropharynx clear and moist. No ulcerations or evidence of oropharyngeal candidiasis. Neck is supple.  NODES:  No cervical, supraclavicular, or axillary lymphadenopathy palpated.  BREAST EXAM:  Deferred. LUNGS:  Clear to auscultation bilaterally.  No wheezes or rhonchi. HEART:  Regular rate and rhythm. No murmur appreciated. ABDOMEN:  Soft, nontender.  Positive, normoactive bowel sounds. No organomegaly palpated. MSK:  No focal spinal tenderness to palpation. Full range of motion bilaterally in the upper extremities. EXTREMITIES:  No peripheral edema.   SKIN:  Clear with no obvious rashes or skin changes. No nail dyscrasia. NEURO:  Nonfocal. Well oriented.  Appropriate  affect.   LABORATORY DATA:  None for this visit.  DIAGNOSTIC IMAGING:  None for this visit.      ASSESSMENT AND PLAN:  Ms.. Tanner is a pleasant 68 y.o. female with Stage *** right/left breast invasive ductal carcinoma, ER+/PR+/HER2-, diagnosed in ***, treated with lumpectomy, adjuvant radiation therapy, and anti-estrogen therapy with *** beginning in ***.  She presents to the Survivorship Clinic for our initial meeting and routine follow-up post-completion of treatment for breast cancer.    1. Stage *** right/left breast cancer:  [name] is continuing to recover from definitive treatment for breast cancer. She will follow-up with her medical oncologist, Dr.  PIERRETTE with history and physical exam per surveillance protocol.  She will continue her anti-estrogen therapy with ***. Thus far, she is tolerating the *** well, with minimal side effects. Her mammogram is due ***; orders placed today.   Today, a comprehensive survivorship care plan and treatment summary was reviewed with the patient today detailing her breast cancer diagnosis, treatment course, potential late/long-term effects of treatment, appropriate follow-up care with recommendations for the future, and patient education resources.  A copy of this summary, along with a letter will be sent to the patient's primary care provider via mail/fax/In Basket message after today's visit.    #. Problem(s) at Visit______________  #. Bone health:  Given [name] age/history of breast cancer and her current treatment regimen including anti-estrogen therapy with ***, she is at risk for bone demineralization.  Her last DEXA scan was ***, which showed ***.  In the meantime, she was encouraged to increase her consumption of foods rich in calcium , as well as increase her weight-bearing activities.  She was given education on specific activities to promote bone health.  #. Cancer screening:  Due to [name] history and her age, she should receive  screening for skin cancers, colon cancer, and gynecologic cancers.  The information and recommendations are listed on the patient's comprehensive care plan/treatment summary and were reviewed in detail with the patient.    #. Health maintenance and wellness promotion: [name] was encouraged to consume 5-7 servings of fruits and vegetables per day. We reviewed the Nutrition Rainbow handout.  She was  also encouraged to engage in moderate to vigorous exercise for 30 minutes per day most days of the week.  She was instructed to limit her alcohol consumption and continue to abstain from tobacco use/***was encouraged stop smoking.     #. Support services/counseling: It is not uncommon for this period of the patient's cancer care trajectory to be one of many emotions and stressors.   She was given information regarding our available services and encouraged to contact me with any questions or for help enrolling in any of our support group/programs.    Follow up instructions:    -Return to cancer center ***  -Mammogram due in *** -She is welcome to return back to the Survivorship Clinic at any time; no additional follow-up needed at this time.  -Consider referral back to survivorship as a long-term survivor for continued surveillance  The patient was provided an opportunity to ask questions and all were answered. The patient agreed with the plan and demonstrated an understanding of the instructions.   Total encounter time:*** minutes*in face-to-face visit time, chart review, lab review, care coordination, order entry, and documentation of the encounter time.    Heidi Kendall, [name] 04/22/24 1:20 PM Medical Oncology and Hematology [facility] [address] [place], [place] [address] Tel. [phone]    Fax. [phone]  *Total Encounter Time as defined by the Centers for Medicare and Medicaid Services includes, in addition to the face-to-face time of a patient visit (documented in  the note above) non-face-to-face time: obtaining and reviewing outside history, ordering and reviewing medications, tests or procedures, care coordination (communications with other health care professionals or caregivers) and documentation in the medical record.

## 2024-04-24 DIAGNOSIS — C50411 Malignant neoplasm of upper-outer quadrant of right female breast: Secondary | ICD-10-CM | POA: Diagnosis not present

## 2024-04-24 DIAGNOSIS — Z170 Estrogen receptor positive status [ER+]: Secondary | ICD-10-CM | POA: Diagnosis not present

## 2024-05-06 ENCOUNTER — Other Ambulatory Visit: Payer: Self-pay

## 2024-05-06 ENCOUNTER — Other Ambulatory Visit (HOSPITAL_COMMUNITY): Payer: Self-pay

## 2024-05-06 DIAGNOSIS — E669 Obesity, unspecified: Secondary | ICD-10-CM | POA: Diagnosis not present

## 2024-05-06 DIAGNOSIS — F330 Major depressive disorder, recurrent, mild: Secondary | ICD-10-CM | POA: Diagnosis not present

## 2024-05-06 DIAGNOSIS — J4520 Mild intermittent asthma, uncomplicated: Secondary | ICD-10-CM | POA: Diagnosis not present

## 2024-05-06 DIAGNOSIS — Z6832 Body mass index (BMI) 32.0-32.9, adult: Secondary | ICD-10-CM | POA: Diagnosis not present

## 2024-05-06 DIAGNOSIS — Z23 Encounter for immunization: Secondary | ICD-10-CM | POA: Diagnosis not present

## 2024-05-06 DIAGNOSIS — I10 Essential (primary) hypertension: Secondary | ICD-10-CM | POA: Diagnosis not present

## 2024-05-06 DIAGNOSIS — E782 Mixed hyperlipidemia: Secondary | ICD-10-CM | POA: Diagnosis not present

## 2024-05-06 DIAGNOSIS — K219 Gastro-esophageal reflux disease without esophagitis: Secondary | ICD-10-CM | POA: Diagnosis not present

## 2024-05-06 DIAGNOSIS — C50911 Malignant neoplasm of unspecified site of right female breast: Secondary | ICD-10-CM | POA: Diagnosis not present

## 2024-05-06 MED ORDER — OMEPRAZOLE 20 MG PO CPDR
20.0000 mg | DELAYED_RELEASE_CAPSULE | Freq: Every day | ORAL | 0 refills | Status: DC | PRN
  Filled 2024-05-06: qty 90, 90d supply, fill #0

## 2024-05-06 MED ORDER — ALBUTEROL SULFATE HFA 108 (90 BASE) MCG/ACT IN AERS
1.0000 | INHALATION_SPRAY | RESPIRATORY_TRACT | 2 refills | Status: AC
  Filled 2024-05-06: qty 6.7, 17d supply, fill #0
  Filled 2024-07-10: qty 6.7, 17d supply, fill #1

## 2024-05-08 ENCOUNTER — Encounter: Payer: Self-pay | Admitting: Obstetrics and Gynecology

## 2024-05-12 ENCOUNTER — Other Ambulatory Visit (HOSPITAL_COMMUNITY): Payer: Self-pay

## 2024-05-13 ENCOUNTER — Other Ambulatory Visit (HOSPITAL_COMMUNITY): Payer: Self-pay

## 2024-05-13 MED ORDER — DULOXETINE HCL 60 MG PO CPEP
60.0000 mg | ORAL_CAPSULE | Freq: Every day | ORAL | 0 refills | Status: DC
  Filled 2024-05-13: qty 90, 90d supply, fill #0

## 2024-05-19 ENCOUNTER — Other Ambulatory Visit (HOSPITAL_COMMUNITY): Payer: Self-pay

## 2024-05-20 ENCOUNTER — Other Ambulatory Visit (HOSPITAL_COMMUNITY): Payer: Self-pay

## 2024-05-20 DIAGNOSIS — Z0000 Encounter for general adult medical examination without abnormal findings: Secondary | ICD-10-CM | POA: Diagnosis not present

## 2024-05-20 DIAGNOSIS — Z23 Encounter for immunization: Secondary | ICD-10-CM | POA: Diagnosis not present

## 2024-05-20 MED ORDER — LOSARTAN POTASSIUM 25 MG PO TABS
25.0000 mg | ORAL_TABLET | Freq: Every day | ORAL | 1 refills | Status: AC
  Filled 2024-05-20: qty 90, 90d supply, fill #0
  Filled 2024-08-18: qty 90, 90d supply, fill #1

## 2024-06-23 DIAGNOSIS — C50411 Malignant neoplasm of upper-outer quadrant of right female breast: Secondary | ICD-10-CM | POA: Diagnosis not present

## 2024-06-23 DIAGNOSIS — Z170 Estrogen receptor positive status [ER+]: Secondary | ICD-10-CM | POA: Diagnosis not present

## 2024-06-24 ENCOUNTER — Other Ambulatory Visit: Payer: Self-pay | Admitting: General Surgery

## 2024-06-24 DIAGNOSIS — Z853 Personal history of malignant neoplasm of breast: Secondary | ICD-10-CM

## 2024-06-26 NOTE — Therapy (Signed)
 OUTPATIENT PHYSICAL THERAPY  UPPER EXTREMITY ONCOLOGY EVALUATION  Patient Name: [name] MRN: [id number] DOB:1955/09/14, 68 y.o., female Today's Date: 06/27/2024  END OF SESSION:  PT End of Session - 06/27/24 0900     Visit Number 1    Number of Visits 12    Date for Recertification  08/08/24    Authorization Type none needed    PT Start Time 0900    PT Stop Time 0958    PT Time Calculation (min) 58 min    Activity Tolerance Patient tolerated treatment well    Behavior During Therapy [facility] for tasks assessed/performed          Past Medical History:  Diagnosis Date   Asthma    Chronic constipation    Depression    History of echocardiogram    a. Echo 10/17: Mild concentric LVH, vigorous LVF, EF 65-70, normal wall motion, grade 1 diastolic dysfunction, GLS -24.4%,   History of stress test    a. ETT-Echo 10/17: normal   HLD (hyperlipidemia)    Hypertension    Pre-diabetes    Past Surgical History:  Procedure Laterality Date   ABDOMINAL HYSTERECTOMY     BREAST BIOPSY Right 10/17/2023   US  RT BREAST BX W LOC DEV 1ST LESION IMG BX SPEC US  GUIDE 10/17/2023 [facility]MO[facility]   BREAST BIOPSY  11/13/2023   MM RT RADIOACTIVE SEED LOC MAMMO GUIDE 11/13/2023 [facility]MO[facility]   BREAST LUMPECTOMY WITH RADIOACTIVE SEED LOCALIZATION Right 11/14/2023   Procedure: BREAST LUMPECTOMY WITH RADIOACTIVE SEED LOCALIZATION;  Surgeon: Ebbie Cough, MD;  Location: [facility];  Service: General;  Laterality: Right;  LMA, Right lumpectomy   SHOULDER SURGERY Left    TONSILLECTOMY     Patient Active Problem List   Diagnosis Date Noted   Malignant neoplasm of upper-outer quadrant of right breast in female, estrogen receptor positive (HCC) 11/07/2023   Central sleep apnea 04/18/2010   Hyperlipidemia 10/21/2008   DEPRESSION 10/21/2008   History of disease 10/21/2008   TONSILLECTOMY, HX OF 10/21/2008   Acquired absence of other organs 10/21/2008    PCP:   REFERRING  PROVIDER: Cough Ebbie, MD  REFERRING DIAG: Right breast Cancer  THERAPY DIAG:  Malignant neoplasm of upper-outer quadrant of right breast in female, estrogen receptor positive (HCC)  ONSET DATE: 11/14/2023  Rationale for Evaluation and Treatment: Rehabilitation  SUBJECTIVE:                                                                                                                                                                                           SUBJECTIVE STATEMENT:  I have a 6  cm hard mass from a hematoma that I am going to have excised in May 2026. The pec muscle gets tight with reaching activities. Breast shrunk with radiation. No swelling in her breast. Feels hot at times. Pt mentions she might like DN. 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   S/p radiation with final treament on 01/23/2024. Pt had a hematoma after surgery aspirated several times. Concerned about hard area that remains and has decreased ROM.  PAIN:  Are you having pain? Yes NPRS scale: 1/10-7/10 Pain location: right breast Pain orientation: Right  PAIN TYPE: sharp sensation to the nipple coming and going all day, and ideep internal itchy, Pain description: intermittent  Aggravating factors: not wearing a bra, night time, some disturbed sleep Relieving factors: bra,cool objects,  PRECAUTIONS: Hypertension, asthma  RED FLAGS: None   WEIGHT BEARING RESTRICTIONS: No  FALLS:  Has patient fallen in last 6 months? 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  COGNITION: Overall cognitive status: Within functional limits for tasks assessed   PALPATION: Large area of induration that is encapsulated from hematoma right lateral breas  OBSERVATIONS / OTHER ASSESSMENTS: right breast drawn upwards from radiation. ? Mild swelling inferior medial breast  SENSATION: Light touch: Deficits     POSTURE: forward head, rounded shoulders  UPPER EXTREMITY AROM/PROM:  A/PROM RIGHT   eval   Shoulder extension 37  Shoulder flexion 158  Shoulder abduction 120, tight, pain  Shoulder internal rotation 58  Shoulder external rotation 98    (Blank rows = not tested)  A/PROM LEFT   eval  Shoulder extension 42  Shoulder flexion 163  Shoulder abduction 172  Shoulder internal rotation 70  Shoulder external rotation 79    (Blank rows = not tested)  CERVICAL AROM: All fxl normal limits:      UPPER EXTREMITY STRENGTH:   LYMPHEDEMA ASSESSMENTS:   SURGERY TYPE/DATE: 11/14/2023 Right Lumpectomy  NUMBER OF LYMPH NODES REMOVED: 0  CHEMOTHERAPY: no  RADIATION:YES  HORMONE TREATMENT: YES  INFECTIONS: NO    FUNCTIONAL TESTS:    GAIT:WNL  BREAST COMPLAINTS QUESTIONNAIRE Pain:7 Heaviness:7 Swollen feeling:0 Tense Skin:3 Redness:0 Bra Print:0 Size of Pores:0 Hard feeling: 10 Total:  27   /80 A Score over 9 indicates lymphedema issues in the breast                                                                                                                             TREATMENT DATE:  Chip pack made for area of induration to place in bra, Standing wall slide x 5 Supine wand flexion and scaption x 3 ea Snow angels x 5 Doorway stretch single arm x 2 HEP updated Discussed DN,POC, LOS, treatment interventions. Agreeable to start 2x/week with manual work and will transition to DN if needed.     PATIENT EDUCATION:  Education details: Access Code: [email or web address] URL:  [email or web address] Date: 06/27/2024 Prepared by: Grayce Sheldon  Exercises Standing wall slide abd, stargazer stretch - Supine Shoulder Flexion with Dowel  - 1 x daily - 7 x weekly - 1 sets - 5 reps - Single Arm Doorway Pec Stretch at 90 Degrees Abduction  - 1 x daily - 7 x weekly - 3 sets - 3 reps - 30 hold - Supine Shoulder Abduction AROM  - 1 x daily - 7 x weekly - 1 sets - 5 reps Person educated: Patient Education method: Explanation, Demonstration, and Handouts Education comprehension: verbalized understanding and returned demonstration  HOME EXERCISE PROGRAM: Standing wall slide abd, stargazer stretch - Supine Shoulder Flexion with Dowel  - 1 x daily - 7 x weekly - 1 sets - 5 reps - Single Arm Doorway Pec Stretch at 90 Degrees Abduction  - 1 x daily - 7 x weekly - 3 sets - 3 reps - 30 hold - Supine Shoulder Abduction AROM  - 1 x daily - 7 x weekly - 1 sets - 5 reps  ASSESSMENT:  CLINICAL IMPRESSION: Patient is a 68 y.o. female who was seen today for physical therapy evaluation and treatment for complaints of tightness in the right pectorals, and hardened area in the right breast from a hematoma that has not resolved. She presents with right breast pain, limitations in shoulder ROM especially for abd, induration at the right breast just above incision. Foam chip pack was made to place over area of induration to try and soften. She was also educated in ROM for the shoulder and pectoral stretching. She will start with manual work and exercise, but is anxious to try DN as well since it was mentioned by MD. She is aware there is a charge for it. She will benefit from skilled therapy to address deficits and return to PLOF.   OBJECTIVE IMPAIRMENTS: decreased knowledge of condition, decreased ROM, increased edema, impaired sensation, impaired vision/preception, and pain.   ACTIVITY LIMITATIONS: bed mobility and reach over head  PARTICIPATION LIMITATIONS: doing all but with  some pain/discomfort  PERSONAL FACTORS: 1-2 comorbidities: breast cancer post radiation are also affecting patient's functional outcome.   REHAB POTENTIAL: Good  CLINICAL DECISION MAKING: Stable/uncomplicated  EVALUATION COMPLEXITY: Low  GOALS: Goals reviewed with patient? Yes  SHORT TERM GOALS=LONG TERM GOALS: Target date: 08/08/2024  Pt will be independent with HEP to improve shoulder ROM and stretch pectorals to decrease pain Baseline: Goal status: INITIAL  2.  Pt will restore right shoulder ROM to WNL Baseline:  Goal status: INITIAL  3.  Pt will notice softening at area of hematoma with chip pack Baseline:  Goal status: INITIAL  4.  Pt will have decreased right breast pain by 50% for improved activity tolerance Baseline:  Goal status: INITIAL  5.  Pt will be independent with MLD as needed to right breast Baseline:  Goal status: INITIAL    PLAN:  PT FREQUENCY: 2x/week  PT DURATION: 6 weeks  PLANNED INTERVENTIONS: 97164- PT Re-evaluation, 97750- Physical Performance Testing, 97110-Therapeutic exercises, 97530- Therapeutic activity, [id number]- Neuromuscular re-education, 97535- Self Care, 02859- Manual therapy, [phone] (  1-2 muscles), 20561 (3+ muscles)- Dry Needling, Therapeutic exercises, Therapeutic activity, Neuromuscular re-education, Gait training, and Self Care  PLAN FOR NEXT SESSION: review HEP prn, pulleys abd, LTR, open book, STM to right Pectoral region, MFR, PROM, initiate DN if needed ( pt is aware of charge)  Grayce JINNY Sheldon, PT 06/27/2024, 10:00 AM

## 2024-06-27 ENCOUNTER — Ambulatory Visit: Attending: General Surgery

## 2024-06-27 ENCOUNTER — Other Ambulatory Visit: Payer: Self-pay

## 2024-06-27 DIAGNOSIS — M25611 Stiffness of right shoulder, not elsewhere classified: Secondary | ICD-10-CM | POA: Diagnosis not present

## 2024-06-27 DIAGNOSIS — Z170 Estrogen receptor positive status [ER+]: Secondary | ICD-10-CM | POA: Insufficient documentation

## 2024-06-27 DIAGNOSIS — N644 Mastodynia: Secondary | ICD-10-CM | POA: Insufficient documentation

## 2024-06-27 DIAGNOSIS — C50411 Malignant neoplasm of upper-outer quadrant of right female breast: Secondary | ICD-10-CM | POA: Insufficient documentation

## 2024-07-09 ENCOUNTER — Ambulatory Visit

## 2024-07-09 DIAGNOSIS — N644 Mastodynia: Secondary | ICD-10-CM | POA: Insufficient documentation

## 2024-07-09 DIAGNOSIS — C50411 Malignant neoplasm of upper-outer quadrant of right female breast: Secondary | ICD-10-CM | POA: Insufficient documentation

## 2024-07-09 DIAGNOSIS — M25611 Stiffness of right shoulder, not elsewhere classified: Secondary | ICD-10-CM | POA: Insufficient documentation

## 2024-07-09 DIAGNOSIS — Z170 Estrogen receptor positive status [ER+]: Secondary | ICD-10-CM | POA: Diagnosis present

## 2024-07-09 NOTE — Therapy (Signed)
 OUTPATIENT PHYSICAL THERAPY  UPPER EXTREMITY ONCOLOGY TREATMENT  Patient Name: [name] MRN: [id number] DOB:1955-12-25, 68 y.o., female Today's Date: 07/09/2024  END OF SESSION:  PT End of Session - 07/09/24 0802     Visit Number 2    Number of Visits 12    Date for Recertification  08/08/24    Authorization Type none needed    PT Start Time 0803    PT Stop Time 0851    PT Time Calculation (min) 48 min    Activity Tolerance Patient tolerated treatment well    Behavior During Therapy [facility] for tasks assessed/performed          Past Medical History:  Diagnosis Date   Asthma    Chronic constipation    Depression    History of echocardiogram    a. Echo 10/17: Mild concentric LVH, vigorous LVF, EF 65-70, normal wall motion, grade 1 diastolic dysfunction, GLS -24.4%,   History of stress test    a. ETT-Echo 10/17: normal   HLD (hyperlipidemia)    Hypertension    Pre-diabetes    Past Surgical History:  Procedure Laterality Date   ABDOMINAL HYSTERECTOMY     BREAST BIOPSY Right 10/17/2023   US  RT BREAST BX W LOC DEV 1ST LESION IMG BX SPEC US  GUIDE 10/17/2023 [facility]   BREAST BIOPSY  11/13/2023   MM RT RADIOACTIVE SEED LOC MAMMO GUIDE 11/13/2023 [facility]   BREAST LUMPECTOMY WITH RADIOACTIVE SEED LOCALIZATION Right 11/14/2023   Procedure: BREAST LUMPECTOMY WITH RADIOACTIVE SEED LOCALIZATION;  Surgeon: Ebbie Cough, MD;  Location: [facility];  Service: General;  Laterality: Right;  LMA, Right lumpectomy   SHOULDER SURGERY Left    TONSILLECTOMY     Patient Active Problem List   Diagnosis Date Noted   Malignant neoplasm of upper-outer quadrant of right breast in female, estrogen receptor positive (HCC) 11/07/2023   Central sleep apnea 04/18/2010   Hyperlipidemia 10/21/2008   DEPRESSION 10/21/2008   History of disease 10/21/2008   TONSILLECTOMY, HX OF 10/21/2008   Acquired absence of other organs 10/21/2008    PCP:   REFERRING  PROVIDER: Cough Ebbie, MD  REFERRING DIAG: Right breast Cancer  THERAPY DIAG:  Malignant neoplasm of upper-outer quadrant of right breast in female, estrogen receptor positive (HCC)  Stiffness of right shoulder, not elsewhere classified  Breast pain, right  ONSET DATE: 11/14/2023  Rationale for Evaluation and Treatment: Rehabilitation  SUBJECTIVE:  SUBJECTIVE STATEMENT:  I have been doing the exercises as much as I can and the yoga is helping too. The pain is improved. I went to [employer] and got some new bras but not compression bras. Pain is 50% better overall.  EVAL I have a 6 cm hard mass from a hematoma that I am going to have excised in May 2026. The pec muscle gets tight with reaching activities. Breast shrunk with radiation. No swelling in her breast. Feels hot at times. Pt mentions she might like DN. 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   S/p radiation with final treament on 01/23/2024. Pt had a hematoma after surgery aspirated several times. Concerned about hard area that remains and has decreased ROM.  PAIN:  Are you having pain? Yes NPRS scale: 0/10 present-7/10 Pain location: right breast Pain orientation: Right  PAIN TYPE: sharp sensation to the nipple coming and going all day, and ideep internal itchy, Pain description: intermittent  Aggravating factors: not wearing a bra, night time, some disturbed sleep Relieving factors: bra,cool objects,  PRECAUTIONS: Hypertension, asthma  RED FLAGS: None   WEIGHT BEARING RESTRICTIONS: No  FALLS:  Has patient fallen in last 6 months? 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  COGNITION: Overall cognitive status: Within functional limits for tasks assessed   PALPATION: Large area of induration that is encapsulated from hematoma right lateral breas  OBSERVATIONS / OTHER ASSESSMENTS: right breast drawn upwards from radiation. ? Mild swelling inferior medial breast  SENSATION: Light touch: Deficits     POSTURE: forward head, rounded shoulders  UPPER EXTREMITY AROM/PROM:  A/PROM RIGHT   eval  RIGHT 07/09/2024  Shoulder extension 37 41  Shoulder flexion 158 164  Shoulder abduction 120, tight, pain 171  Shoulder internal rotation 58 70  Shoulder external rotation 98 100    (Blank rows = not tested)  A/PROM LEFT   eval  Shoulder extension 42  Shoulder flexion 163  Shoulder abduction 172  Shoulder internal rotation 70  Shoulder external rotation 79    (Blank rows = not tested)  CERVICAL AROM: All fxl normal limits:      UPPER EXTREMITY STRENGTH:   LYMPHEDEMA ASSESSMENTS:   SURGERY TYPE/DATE: 11/14/2023 Right Lumpectomy  NUMBER OF LYMPH NODES REMOVED: 0  CHEMOTHERAPY: no  RADIATION:YES  HORMONE TREATMENT: YES  INFECTIONS: NO    FUNCTIONAL TESTS:    GAIT:WNL  BREAST COMPLAINTS QUESTIONNAIRE Pain:7 Heaviness:7 Swollen feeling:0 Tense Skin:3 Redness:0 Bra Print:0 Size of Pores:0 Hard feeling: 10 Total:  27   /80 A Score over 9 indicates lymphedema issues in the breast  TREATMENT DATE:  07/09/2024 Overhead pulleys flexion x 2 min, abd x 2 min Ball rolls on  wall x 5 flexion and abd Lower trunk rotation x 5 B, Supine AROM bilateral flexion, scaption, horizontal abd x 5 Standing lat stretch at laundry cart STM Right pectorals, UT, lateral trunk with cocoa butter PROM right shoulder flexion, abduction, ER Measured AROM right shoulder     06/27/2024 Chip pack made for area of induration to place in bra, Standing wall slide x 5 Supine wand flexion and scaption x 3 ea Snow angels x 5 Doorway stretch single arm x 2 HEP updated Discussed DN,POC, LOS, treatment interventions. Agreeable to start 2x/week with manual work and will transition to DN if needed.     PATIENT EDUCATION:  Education details: Access Code: [email or web address] URL: [email or web address] Date: 06/27/2024 Prepared by: Grayce Sheldon  Exercises Standing wall slide abd, stargazer stretch - Supine Shoulder Flexion with Dowel  - 1 x daily - 7 x weekly - 1 sets - 5 reps - Single Arm Doorway Pec Stretch at 90 Degrees Abduction  - 1 x daily - 7 x weekly - 3 sets - 3 reps - 30 hold - Supine Shoulder Abduction AROM  - 1 x daily - 7 x weekly - 1 sets - 5 reps Person educated: Patient Education method: Explanation, Demonstration, and Handouts Education comprehension: verbalized understanding and returned demonstration  HOME EXERCISE PROGRAM: Standing wall slide abd, stargazer stretch - Supine Shoulder Flexion with Dowel  - 1 x daily - 7 x weekly - 1 sets - 5 reps - Single Arm Doorway Pec Stretch at 90 Degrees Abduction  - 1 x daily - 7 x weekly - 3 sets - 3 reps - 30 hold - Supine Shoulder Abduction AROM  - 1 x daily - 7 x weekly - 1 sets - 5 reps  ASSESSMENT:  CLINICAL IMPRESSION:  Pt is making excellent progress. AROM is now WNL and pain improved by 50%. Area of induration is still present. She is not wearing compression bras, but does use the chip pack. There is no noted softening of hematoma, but pain is gratly improved. Pt will follow up in 2 weeks after return from  [place].  Patient is a 68 y.o. female who was seen today for physical therapy evaluation and treatment for complaints of tightness in the right pectorals, and hardened area in the right breast from a hematoma that has not resolved. She presents with right breast pain, limitations in shoulder ROM especially for abd, induration at the right breast just above incision. Foam chip pack was made to place over area of induration to try and soften. She was also educated in ROM for the shoulder and pectoral stretching. She will start with manual work and exercise, but is anxious to try DN as well since it was mentioned by MD. She is aware there is a charge for it. She will benefit from skilled therapy to address deficits and return to PLOF.   OBJECTIVE IMPAIRMENTS: decreased knowledge of condition, decreased ROM, increased edema, impaired sensation, impaired vision/preception, and pain.   ACTIVITY LIMITATIONS: bed mobility and reach over head  PARTICIPATION LIMITATIONS: doing all but with some pain/discomfort  PERSONAL FACTORS: 1-2 comorbidities: breast cancer post radiation are also affecting patient's functional outcome.   REHAB POTENTIAL: Good  CLINICAL DECISION MAKING: Stable/uncomplicated  EVALUATION COMPLEXITY: Low  GOALS: Goals reviewed with patient? Yes  SHORT TERM GOALS=LONG TERM GOALS: Target date: 08/08/2024  Pt will be independent with HEP to improve  shoulder ROM and stretch pectorals to decrease pain Baseline: Goal status: MET 12/3 2.  Pt will restore right shoulder ROM to WNL Baseline:  Goal status: MET 07/09/2024  3.  Pt will notice softening at area of hematoma with chip pack Baseline:  Goal status: INITIAL  4.  Pt will have decreased right breast pain by 50% for improved activity tolerance Baseline:  Goal status:MET 07/09/2024 5.  Pt will be independent with MLD as needed to right breast Baseline:  Goal status: INITIAL    PLAN:  PT FREQUENCY: 2x/week  PT  DURATION: 6 weeks  PLANNED INTERVENTIONS: 97164- PT Re-evaluation, 97750- Physical Performance Testing, 97110-Therapeutic exercises, 97530- Therapeutic activity, 97112- Neuromuscular re-education, 97535- Self Care, 02859- Manual therapy, 20560 (1-2 muscles), 20561 (3+ muscles)- Dry Needling, Therapeutic exercises, Therapeutic activity, Neuromuscular re-education, Gait training, and Self Care  PLAN FOR NEXT SESSION: review HEP prn, pulleys abd, LTR, open book, STM to right Pectoral region, MFR, PROM, initiate DN if needed ( pt is aware of charge)  Grayce JINNY Sheldon, PT 07/09/2024, 8:55 AM

## 2024-07-10 ENCOUNTER — Other Ambulatory Visit (HOSPITAL_COMMUNITY): Payer: Self-pay

## 2024-07-15 ENCOUNTER — Ambulatory Visit

## 2024-07-23 ENCOUNTER — Ambulatory Visit

## 2024-07-24 ENCOUNTER — Other Ambulatory Visit: Payer: Self-pay

## 2024-07-24 MED ORDER — ATORVASTATIN CALCIUM 40 MG PO TABS
40.0000 mg | ORAL_TABLET | Freq: Every day | ORAL | 0 refills | Status: AC
  Filled 2024-07-24: qty 90, 90d supply, fill #0

## 2024-07-25 ENCOUNTER — Ambulatory Visit

## 2024-07-25 DIAGNOSIS — N644 Mastodynia: Secondary | ICD-10-CM

## 2024-07-25 DIAGNOSIS — Z170 Estrogen receptor positive status [ER+]: Secondary | ICD-10-CM

## 2024-07-25 DIAGNOSIS — M25611 Stiffness of right shoulder, not elsewhere classified: Secondary | ICD-10-CM

## 2024-07-25 DIAGNOSIS — C50411 Malignant neoplasm of upper-outer quadrant of right female breast: Secondary | ICD-10-CM | POA: Diagnosis not present

## 2024-07-25 NOTE — Therapy (Signed)
 " OUTPATIENT PHYSICAL THERAPY  UPPER EXTREMITY ONCOLOGY TREATMENT  Patient Name: [name] MRN: [id number] DOB:10/26/1955, 68 y.o., female Today's Date: 07/25/2024  END OF SESSION:  PT End of Session - 07/25/24 0908     Visit Number 3    Number of Visits 12    Date for Recertification  08/08/24    Authorization Type none needed    PT Start Time 0906    PT Stop Time 1002    PT Time Calculation (min) 56 min    Activity Tolerance Patient tolerated treatment well    Behavior During Therapy [facility] for tasks assessed/performed          Past Medical History:  Diagnosis Date   Asthma    Chronic constipation    Depression    History of echocardiogram    a. Echo 10/17: Mild concentric LVH, vigorous LVF, EF 65-70, normal wall motion, grade 1 diastolic dysfunction, GLS -24.4%,   History of stress test    a. ETT-Echo 10/17: normal   HLD (hyperlipidemia)    Hypertension    Pre-diabetes    Past Surgical History:  Procedure Laterality Date   ABDOMINAL HYSTERECTOMY     BREAST BIOPSY Right 10/17/2023   US  RT BREAST BX W LOC DEV 1ST LESION IMG BX SPEC US  GUIDE 10/17/2023 [facility]   BREAST BIOPSY  11/13/2023   MM RT RADIOACTIVE SEED LOC MAMMO GUIDE 11/13/2023 [facility]   BREAST LUMPECTOMY WITH RADIOACTIVE SEED LOCALIZATION Right 11/14/2023   Procedure: BREAST LUMPECTOMY WITH RADIOACTIVE SEED LOCALIZATION;  Surgeon: Ebbie Cough, MD;  Location: [facility];  Service: General;  Laterality: Right;  LMA, Right lumpectomy   SHOULDER SURGERY Left    TONSILLECTOMY     Patient Active Problem List   Diagnosis Date Noted   Malignant neoplasm of upper-outer quadrant of right breast in female, estrogen receptor positive (HCC) 11/07/2023   Central sleep apnea 04/18/2010   Hyperlipidemia 10/21/2008   DEPRESSION 10/21/2008   History of disease 10/21/2008   TONSILLECTOMY, HX OF 10/21/2008   Acquired absence of other organs 10/21/2008    PCP:   REFERRING  PROVIDER: Cough Ebbie, MD  REFERRING DIAG: Right breast Cancer  THERAPY DIAG:  Malignant neoplasm of upper-outer quadrant of right breast in female, estrogen receptor positive (HCC)  Stiffness of right shoulder, not elsewhere classified  Breast pain, right  ONSET DATE: 11/14/2023  Rationale for Evaluation and Treatment: Rehabilitation  SUBJECTIVE:  SUBJECTIVE STATEMENT:  I got back from [place] Monday night and when I got home my husband told me he had a open pressure sore on his foot so we spent all day Tuesday in the ED. So I haven't been able to do my exercises consistently.   EVAL I have a 6 cm hard mass from a hematoma that I am going to have excised in May 2026. The pec muscle gets tight with reaching activities. Breast shrunk with radiation. No swelling in her breast. Feels hot at times. Pt mentions she might like DN. 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   S/p radiation with final treament on 01/23/2024. Pt had a hematoma after surgery aspirated several times. Concerned about hard area that remains and has decreased ROM.  PAIN:  Are you having pain? Yes NPRS scale:2/10 Pain location: right breast Pain orientation: Right  PAIN TYPE: sore at the hemotoma Pain description: intermittent  Aggravating factors: not wearing a bra, night time, some disturbed sleep Relieving factors: bra,cool objects,  PRECAUTIONS: Hypertension, asthma  RED FLAGS: None   WEIGHT BEARING RESTRICTIONS: No  FALLS:  Has patient fallen in last 6 months? No  LIVING ENVIRONMENT: Lives with: lives with their spouse Lives in: House/apartment   OCCUPATION:  risk analyst, judge for  Teacher, [employer],  LEISURE: travel, judging, yoga and Tai chai   HAND DOMINANCE: right   PRIOR LEVEL OF FUNCTION: Independent  PATIENT GOALS: decrease pain   OBJECTIVE: Note: Objective measures were completed at Evaluation unless otherwise noted.  COGNITION: Overall cognitive status: Within functional limits for tasks assessed   PALPATION: Large area of induration that is encapsulated from hematoma right lateral breas  OBSERVATIONS / OTHER ASSESSMENTS: right breast drawn upwards from radiation. ? Mild swelling inferior medial breast  SENSATION: Light touch: Deficits     POSTURE: forward head, rounded shoulders  UPPER EXTREMITY AROM/PROM:  A/PROM RIGHT   eval  RIGHT 07/09/2024  Shoulder extension 37 41  Shoulder flexion 158 164  Shoulder abduction 120, tight, pain 171  Shoulder internal rotation 58 70  Shoulder external rotation 98 100    (Blank rows = not tested)  A/PROM LEFT   eval  Shoulder extension 42  Shoulder flexion 163  Shoulder abduction 172  Shoulder internal rotation 70  Shoulder external rotation 79    (Blank rows = not tested)  CERVICAL AROM: All fxl normal limits:      UPPER EXTREMITY STRENGTH:   LYMPHEDEMA ASSESSMENTS:   SURGERY TYPE/DATE: 11/14/2023 Right Lumpectomy  NUMBER OF LYMPH NODES REMOVED: 0  CHEMOTHERAPY: no  RADIATION:YES  HORMONE TREATMENT: YES  INFECTIONS: NO    FUNCTIONAL TESTS:    GAIT:WNL  BREAST COMPLAINTS QUESTIONNAIRE Pain:7 Heaviness:7 Swollen feeling:0 Tense Skin:3 Redness:0 Bra Print:0 Size of Pores:0 Hard feeling: 10 Total:  27   /80 A Score over 9 indicates lymphedema issues in the breast  TREATMENT DATE: 07/25/24: Therapeutic Exercises Overhead pulleys flexion x 2 min, abd x 2 min Ball rolls on wall x 10 flexion and Rt abd  each Therapeutic Activities Supine over half foam roll for following: Bil UE horz abd, then bil UE scaption into a V x 15 each; then bil UE abd in a snow angel x 15, 5 sec holds; pt repots feeling very good stretches with these Modified downward dog on wall x 5 reps, 5 sec holds Manual Therapy P/ROM to Rt shoulder into flex, abd and D2 with scapular depression by therapist throughout STM to hematoma with mod pressure with UE in varying positions; also along lateral trunk/lateral border of scapula where min tightness reported Lt S/L for STM to medial scap border and scap mobs into protraction and retraction  07/09/2024 Overhead pulleys flexion x 2 min, abd x 2 min Ball rolls on wall x 5 flexion and abd Lower trunk rotation x 5 B, Supine AROM bilateral flexion, scaption, horizontal abd x 5 Standing lat stretch at laundry cart STM Right pectorals, UT, lateral trunk with cocoa butter PROM right shoulder flexion, abduction, ER Measured AROM right shoulder     06/27/2024 Chip pack made for area of induration to place in bra, Standing wall slide x 5 Supine wand flexion and scaption x 3 ea Snow angels x 5 Doorway stretch single arm x 2 HEP updated Discussed DN,POC, LOS, treatment interventions. Agreeable to start 2x/week with manual work and will transition to DN if needed.     PATIENT EDUCATION:  Education details: Access Code: [email or web address] URL: [email or web address] Date: 06/27/2024 Prepared by: Grayce Sheldon  Exercises Standing wall slide abd, stargazer stretch - Supine Shoulder Flexion with Dowel  - 1 x daily - 7 x weekly - 1 sets - 5 reps - Single Arm Doorway Pec Stretch at 90 Degrees Abduction  - 1 x daily - 7 x weekly - 3 sets - 3 reps - 30 hold - Supine Shoulder Abduction AROM  - 1 x daily - 7 x weekly - 1 sets - 5 reps Person educated: Patient Education method: Explanation, Demonstration, and Handouts Education comprehension: verbalized understanding and  returned demonstration  HOME EXERCISE PROGRAM: Standing wall slide abd, stargazer stretch - Supine Shoulder Flexion with Dowel  - 1 x daily - 7 x weekly - 1 sets - 5 reps - Single Arm Doorway Pec Stretch at 90 Degrees Abduction  - 1 x daily - 7 x weekly - 3 sets - 3 reps - 30 hold - Supine Shoulder Abduction AROM  - 1 x daily - 7 x weekly - 1 sets - 5 reps  ASSESSMENT:  CLINICAL IMPRESSION: Softening noted to hematoma by pt. She reports borders aren't as firm as they have been. Encouraged her to cont STM with arm in varying positions of motions as done today. Focused on stretching today as pt reports she hasn't been able to do much of this since here last due to travel and then her husband needed to go to the hospital and receive care for pressure sores. She reports feeling good stretches with all motions today and felt better after session. She made at least one more visit for now and will benefit from furthering HEP to include postural strength at that time.   Patient is a 68 y.o. female who was seen today for physical therapy evaluation and treatment for complaints of tightness in the right pectorals, and hardened area in the right breast from a hematoma that has not resolved.  She presents with right breast pain, limitations in shoulder ROM especially for abd, induration at the right breast just above incision. Foam chip pack was made to place over area of induration to try and soften. She was also educated in ROM for the shoulder and pectoral stretching. She will start with manual work and exercise, but is anxious to try DN as well since it was mentioned by MD. She is aware there is a charge for it. She will benefit from skilled therapy to address deficits and return to PLOF.   OBJECTIVE IMPAIRMENTS: decreased knowledge of condition, decreased ROM, increased edema, impaired sensation, impaired vision/preception, and pain.   ACTIVITY LIMITATIONS: bed mobility and reach over head  PARTICIPATION  LIMITATIONS: doing all but with some pain/discomfort  PERSONAL FACTORS: 1-2 comorbidities: breast cancer post radiation are also affecting patient's functional outcome.   REHAB POTENTIAL: Good  CLINICAL DECISION MAKING: Stable/uncomplicated  EVALUATION COMPLEXITY: Low  GOALS: Goals reviewed with patient? Yes  SHORT TERM GOALS=LONG TERM GOALS: Target date: 08/08/2024  Pt will be independent with HEP to improve shoulder ROM and stretch pectorals to decrease pain Baseline: Goal status: MET 12/3 2.  Pt will restore right shoulder ROM to WNL Baseline:  Goal status: MET 07/09/2024  3.  Pt will notice softening at area of hematoma with chip pack Baseline:  Goal status: INITIAL  4.  Pt will have decreased right breast pain by 50% for improved activity tolerance Baseline:  Goal status:MET 07/09/2024 5.  Pt will be independent with MLD as needed to right breast Baseline:  Goal status: INITIAL    PLAN:  PT FREQUENCY: 2x/week  PT DURATION: 6 weeks  PLANNED INTERVENTIONS: 02835- PT Re-evaluation, 97750- Physical Performance Testing, 97110-Therapeutic exercises, 97530- Therapeutic activity, 97112- Neuromuscular re-education, 97535- Self Care, 02859- Manual therapy, 20560 (1-2 muscles), 20561 (3+ muscles)- Dry Needling, Therapeutic exercises, Therapeutic activity, Neuromuscular re-education, Gait training, and Self Care  PLAN FOR NEXT SESSION: Pt may be ready for D/C next, Add supine scapular series to HEP, pulleys abd, LTR, open book, STM to right Pectoral region, MFR, PROM, initiate DN if needed ( pt is aware of charge)  Aden Berwyn Caldron, PTA 07/25/2024, 10:13 AM  "

## 2024-08-10 ENCOUNTER — Other Ambulatory Visit (HOSPITAL_COMMUNITY): Payer: Self-pay

## 2024-08-11 ENCOUNTER — Other Ambulatory Visit: Payer: Self-pay

## 2024-08-11 ENCOUNTER — Other Ambulatory Visit (HOSPITAL_COMMUNITY): Payer: Self-pay

## 2024-08-11 MED ORDER — DULOXETINE HCL 60 MG PO CPEP
60.0000 mg | ORAL_CAPSULE | Freq: Every day | ORAL | 0 refills | Status: AC
  Filled 2024-08-11: qty 90, 90d supply, fill #0

## 2024-08-13 ENCOUNTER — Ambulatory Visit: Attending: General Surgery

## 2024-08-13 ENCOUNTER — Other Ambulatory Visit (HOSPITAL_COMMUNITY): Payer: Self-pay

## 2024-08-13 DIAGNOSIS — C50411 Malignant neoplasm of upper-outer quadrant of right female breast: Secondary | ICD-10-CM | POA: Diagnosis present

## 2024-08-13 DIAGNOSIS — Z170 Estrogen receptor positive status [ER+]: Secondary | ICD-10-CM | POA: Diagnosis present

## 2024-08-13 DIAGNOSIS — M25611 Stiffness of right shoulder, not elsewhere classified: Secondary | ICD-10-CM | POA: Diagnosis present

## 2024-08-13 DIAGNOSIS — N644 Mastodynia: Secondary | ICD-10-CM | POA: Diagnosis present

## 2024-08-13 NOTE — Patient Instructions (Addendum)
 SABRA

## 2024-08-13 NOTE — Therapy (Signed)
 " OUTPATIENT PHYSICAL THERAPY  UPPER EXTREMITY ONCOLOGY TREATMENT  Patient Name: [name] MRN: [id number] DOB:02-Mar-1956, 69 y.o., female Today's Date: 08/13/2024  END OF SESSION:  PT End of Session - 08/13/24 0914     Visit Number 4    Number of Visits 12    Date for Recertification  08/08/24    Authorization Type none needed    PT Start Time 0911    PT Stop Time 1000    PT Time Calculation (min) 49 min    Activity Tolerance Patient tolerated treatment well    Behavior During Therapy [facility] for tasks assessed/performed          Past Medical History:  Diagnosis Date   Asthma    Chronic constipation    Depression    History of echocardiogram    a. Echo 10/17: Mild concentric LVH, vigorous LVF, EF 65-70, normal wall motion, grade 1 diastolic dysfunction, GLS -24.4%,   History of stress test    a. ETT-Echo 10/17: normal   HLD (hyperlipidemia)    Hypertension    Pre-diabetes    Past Surgical History:  Procedure Laterality Date   ABDOMINAL HYSTERECTOMY     BREAST BIOPSY Right 10/17/2023   US  RT BREAST BX W LOC DEV 1ST LESION IMG BX SPEC US  GUIDE 10/17/2023 [facility]   BREAST BIOPSY  11/13/2023   MM RT RADIOACTIVE SEED LOC MAMMO GUIDE 11/13/2023 [facility]   BREAST LUMPECTOMY WITH RADIOACTIVE SEED LOCALIZATION Right 11/14/2023   Procedure: BREAST LUMPECTOMY WITH RADIOACTIVE SEED LOCALIZATION;  Surgeon: Ebbie Cough, MD;  Location: [facility];  Service: General;  Laterality: Right;  LMA, Right lumpectomy   SHOULDER SURGERY Left    TONSILLECTOMY     Patient Active Problem List   Diagnosis Date Noted   Malignant neoplasm of upper-outer quadrant of right breast in female, estrogen receptor positive (HCC) 11/07/2023   Central sleep apnea 04/18/2010   Hyperlipidemia 10/21/2008   DEPRESSION 10/21/2008   History of disease 10/21/2008   TONSILLECTOMY, HX OF 10/21/2008   Acquired absence of other organs 10/21/2008    PCP:   REFERRING  PROVIDER: Cough Ebbie, MD  REFERRING DIAG: Right breast Cancer  THERAPY DIAG:  Malignant neoplasm of upper-outer quadrant of right breast in female, estrogen receptor positive (HCC)  Stiffness of right shoulder, not elsewhere classified  Breast pain, right  ONSET DATE: 11/14/2023  Rationale for Evaluation and Treatment: Rehabilitation  SUBJECTIVE:  SUBJECTIVE STATEMENT:  I have been doing really well. The hematoma is feeling softer little by little. I think I'm ready to D/C today.   EVAL I have a 6 cm hard mass from a hematoma that I am going to have excised in May 2026. The pec muscle gets tight with reaching activities. Breast shrunk with radiation. No swelling in her breast. Feels hot at times. Pt mentions she might like DN. 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   S/p radiation with final treament on 01/23/2024. Pt had a hematoma after surgery aspirated several times. Concerned about hard area that remains and has decreased ROM.  PAIN:  Are you having pain? No  PRECAUTIONS: Hypertension, asthma  RED FLAGS: None   WEIGHT BEARING RESTRICTIONS: No  FALLS:  Has patient fallen in last 6 months? No  LIVING ENVIRONMENT: Lives with: lives with their spouse Lives in: House/apartment   OCCUPATION: risk analyst, judge for  [employer],  LEISURE: travel, judging, yoga and Tai chai   HAND DOMINANCE: right   PRIOR LEVEL OF FUNCTION: Independent  PATIENT GOALS: decrease pain   OBJECTIVE: Note: Objective measures were completed at Evaluation unless otherwise noted.  COGNITION: Overall cognitive status: Within functional  limits for tasks assessed   PALPATION: Large area of induration that is encapsulated from hematoma right lateral breas  OBSERVATIONS / OTHER ASSESSMENTS: right breast drawn upwards from radiation. ? Mild swelling inferior medial breast  SENSATION: Light touch: Deficits     POSTURE: forward head, rounded shoulders  UPPER EXTREMITY AROM/PROM:  A/PROM RIGHT   eval  RIGHT 07/09/2024  Shoulder extension 37 41  Shoulder flexion 158 164  Shoulder abduction 120, tight, pain 171  Shoulder internal rotation 58 70  Shoulder external rotation 98 100    (Blank rows = not tested)  A/PROM LEFT   eval  Shoulder extension 42  Shoulder flexion 163  Shoulder abduction 172  Shoulder internal rotation 70  Shoulder external rotation 79    (Blank rows = not tested)  CERVICAL AROM: All fxl normal limits:      UPPER EXTREMITY STRENGTH:   LYMPHEDEMA ASSESSMENTS:   SURGERY TYPE/DATE: 11/14/2023 Right Lumpectomy  NUMBER OF LYMPH NODES REMOVED: 0  CHEMOTHERAPY: no  RADIATION:YES  HORMONE TREATMENT: YES  INFECTIONS: NO    FUNCTIONAL TESTS:    GAIT:WNL  BREAST COMPLAINTS QUESTIONNAIRE Pain:7 Heaviness:7 Swollen feeling:0 Tense Skin:3 Redness:0 Bra Print:0 Size of Pores:0 Hard feeling: 10 Total:  27   /80 A Score over 9 indicates lymphedema issues in the breast                                                                                                                             TREATMENT DATE: 08/14/23: Therapeutic Activities Supine over full foam roll  for following: Bil UE horz abd trial but no stretch felt so stopped, then bil UE scaption into a V x 15 each; then bil UE abd in a snow angel x 10, 5 sec holds Lt S/L for Rt shoulder open book stretch x 10, 3-5 sec holds Modified downward dog on wall x 5 reps, 5 sec holds Supine Scapular series with red theraband x 5 - 10 each returning therapist demo and added to HEP Manual Therapy MLD: Instructed pt in this  while performing modified version for assist with area around hematoma, did not do full sequence as pt does not have breast lymphedema at this time: Short neck, 5 diaphragmatic breaths, Lt axillary nodes and anterior inter-axillary anastomosis then focused on STM to hematoma during and finished with anastomosis to LN's. Had pt return demo while performing and issued handout. She was able to return very good demo and verbalize good overall understanding.  P/ROM to Rt shoulder into flex, abd and D2 with scapular depression by therapist throughout STM to hematoma with mod pressure with UE in varying positions  07/25/24: Therapeutic Exercises Overhead pulleys flexion x 2 min, abd x 2 min Ball rolls on wall x 10 flexion and Rt abd each Therapeutic Activities Supine over half foam roll for following: Bil UE horz abd, then bil UE scaption into a V x 15 each; then bil UE abd in a snow angel x 15, 5 sec holds; pt repots feeling very good stretches with these Modified downward dog on wall x 5 reps, 5 sec holds Manual Therapy P/ROM to Rt shoulder into flex, abd and D2 with scapular depression by therapist throughout STM to hematoma with mod pressure with UE in varying positions; also along lateral trunk/lateral border of scapula where min tightness reported Lt S/L for STM to medial scap border and scap mobs into protraction and retraction  07/09/2024 Overhead pulleys flexion x 2 min, abd x 2 min Ball rolls on wall x 5 flexion and abd Lower trunk rotation x 5 B, Supine AROM bilateral flexion, scaption, horizontal abd x 5 Standing lat stretch at laundry cart STM Right pectorals, UT, lateral trunk with cocoa butter PROM right shoulder flexion, abduction, ER Measured AROM right shoulder       PATIENT EDUCATION:  Education details: Access Code: [email or web address] URL: [email or web address] Date: 06/27/2024 Prepared by: Grayce Sheldon  Exercises Standing wall slide abd, stargazer  stretch - Supine Shoulder Flexion with Dowel  - 1 x daily - 7 x weekly - 1 sets - 5 reps - Single Arm Doorway Pec Stretch at 90 Degrees Abduction  - 1 x daily - 7 x weekly - 3 sets - 3 reps - 30 hold - Supine Shoulder Abduction AROM  - 1 x daily - 7 x weekly - 1 sets - 5 reps Exercises: Supine Scapular series with red theraband and self MLD Person educated: Patient Education method: Explanation, Demonstration, and Handouts Education comprehension: verbalized understanding and returned demonstration  HOME EXERCISE PROGRAM: Standing wall slide abd, stargazer stretch - Supine Shoulder Flexion with Dowel  - 1 x daily - 7 x weekly - 1 sets - 5 reps - Single Arm Doorway Pec Stretch at 90 Degrees Abduction  - 1 x daily - 7 x weekly - 3 sets - 3 reps - 30 hold - Supine Shoulder Abduction AROM  - 1 x daily - 7 x weekly - 1 sets - 5 reps - Supine scapular series with red theraband -Self MLD  ASSESSMENT:  CLINICAL IMPRESSION: Pt  reports she has noticed great improvement since start of therapy. Some softening is noted of hematoma so encouraged pt to cont with use of chip pack and manual techniques that she has been instructed in during PT. Also instructed pt in supine scapular series with red theraband. Instructed pt in self MLD and had her return demo and issued handout for this. She has met all goals and is ready for D/C at this time.   Patient is a 69 y.o. female who was seen today for physical therapy evaluation and treatment for complaints of tightness in the right pectorals, and hardened area in the right breast from a hematoma that has not resolved. She presents with right breast pain, limitations in shoulder ROM especially for abd, induration at the right breast just above incision. Foam chip pack was made to place over area of induration to try and soften. She was also educated in ROM for the shoulder and pectoral stretching. She will start with manual work and exercise, but is anxious to try DN  as well since it was mentioned by MD. She is aware there is a charge for it. She will benefit from skilled therapy to address deficits and return to PLOF.   OBJECTIVE IMPAIRMENTS: decreased knowledge of condition, decreased ROM, increased edema, impaired sensation, impaired vision/preception, and pain.   ACTIVITY LIMITATIONS: bed mobility and reach over head  PARTICIPATION LIMITATIONS: doing all but with some pain/discomfort  PERSONAL FACTORS: 1-2 comorbidities: breast cancer post radiation are also affecting patient's functional outcome.   REHAB POTENTIAL: Good  CLINICAL DECISION MAKING: Stable/uncomplicated  EVALUATION COMPLEXITY: Low  GOALS: Goals reviewed with patient? Yes  SHORT TERM GOALS=LONG TERM GOALS: Target date: 08/08/2024  Pt will be independent with HEP to improve shoulder ROM and stretch pectorals to decrease pain Baseline: Goal status: MET 12/3 2.  Pt will restore right shoulder ROM to WNL Baseline:  Goal status: MET 07/09/2024  3.  Pt will notice softening at area of hematoma with chip pack Baseline: 08/13/24 - pt reports this is still present but she does feel this has softened Goal status: MET  4.  Pt will have decreased right breast pain by 50% for improved activity tolerance Baseline:  Goal status: MET 07/09/2024 5.  Pt will be independent with MLD as needed to right breast Baseline:  Goal status: MET    PLAN:  PT FREQUENCY: 2x/week  PT DURATION: 6 weeks  PLANNED INTERVENTIONS: 02835- PT Re-evaluation, 97750- Physical Performance Testing, 97110-Therapeutic exercises, 97530- Therapeutic activity, [id number]- Neuromuscular re-education, 97535- Self Care, 02859- Manual therapy, 20560 (1-2 muscles), 20561 (3+ muscles)- Dry Needling, Therapeutic exercises, Therapeutic activity, Neuromuscular re-education, Gait training, and Self Care  PLAN FOR NEXT SESSION: D/C this visit.  PHYSICAL THERAPY DISCHARGE SUMMARY  Visits from Start of Care: 4  Current functional  level related to goals / functional outcomes: Achieved all goals   Remaining deficits: Still has area of hematoma, but softening   Education / Equipment: HEP, ROM, strength, MLD   Patient agrees to discharge. Patient goals were met. Patient is being discharged due to meeting the stated rehab goals.and feeling ready for DC.   Aden Berwyn Caldron, PTA 08/13/2024, 12:10 PM Grayce Sheldon, PT 08/13/2024 12:10 PM   Over Head Pull: Narrow and Wide Grip   Cancer Rehab [phone]   On back, knees bent, feet flat, band across thighs, elbows straight but relaxed. Pull hands apart (start). Keeping elbows straight, bring arms up and over head, hands toward floor. Keep pull steady on  band. Hold momentarily. Return slowly, keeping pull steady, back to start. Then do same with a wider grip on the band (past shoulder width) Repeat _5-10__ times. Band color __red____   Side Pull: Double Arm   On back, knees bent, feet flat. Arms perpendicular to body, shoulder level, elbows straight but relaxed. Pull arms out to sides, elbows straight. Resistance band comes across collarbones, hands toward floor. Hold momentarily. Slowly return to starting position. Repeat _5-10__ times. Band color _red____   Sword   On back, knees bent, feet flat, left hand on left hip, right hand above left. Pull right arm DIAGONALLY (hip to shoulder) across chest. Bring right arm along head toward floor. Hold momentarily. Slowly return to starting position. Repeat _5-10__ times. Do with left arm. Band color _red_____   Shoulder Rotation: Double Arm   On back, knees bent, feet flat, elbows tucked at sides, bent 90, hands palms up. Pull hands apart and down toward floor, keeping elbows near sides. Hold momentarily. Slowly return to starting position. Repeat _5-10__ times. Band color __red____   Self manual lymph drainage: Perform this sequence once a day.  Only give enough pressure no your skin to make the skin move.  Hug  yourself (easier with one hand at a time).  Do circles at your neck just above your collarbones.  Repeat this 10 times.  Diaphragmatic - Supine   Inhale through nose making navel move out toward hands. Exhale through puckered lips, hands follow navel in. Repeat _5__ times. Rest _10__ seconds between repeats.    Axilla - One at a Time   Using full weight of flat hand and fingers at center of uninvolved armpit, make _10__ in-place circles.    With small finger side of hand against hip crease on involved side, gently perform circles at the crease. Repeat __10_ times.   Now gently stretch skin from the involved side to the uninvolved side across the chest at the shoulder line.  Repeat that 4 times.  Focus on upper and outer breast tissue including massage over hematoma.  Finish by doing the pathway as described above going across your upper chest from the involved shoulder to the uninvolved shoulder.  Repeat the steps above where you did circles in the left armpit.  Cancer Rehab [phone] "

## 2024-08-18 ENCOUNTER — Other Ambulatory Visit: Payer: Self-pay

## 2024-08-18 ENCOUNTER — Other Ambulatory Visit (HOSPITAL_COMMUNITY): Payer: Self-pay

## 2024-08-18 MED ORDER — OMEPRAZOLE 20 MG PO CPDR
20.0000 mg | DELAYED_RELEASE_CAPSULE | Freq: Every day | ORAL | 0 refills | Status: AC
  Filled 2024-08-18: qty 90, 90d supply, fill #0

## 2024-10-07 ENCOUNTER — Encounter

## 2024-10-07 ENCOUNTER — Other Ambulatory Visit

## 2024-10-21 ENCOUNTER — Ambulatory Visit: Admitting: Hematology and Oncology
